# Patient Record
Sex: Female | Born: 1940
Health system: Southern US, Community
[De-identification: ages and names within clinical notes are randomized; demographics above are authoritative.]

## PROBLEM LIST (undated history)

## (undated) DIAGNOSIS — I10 Essential (primary) hypertension: Secondary | ICD-10-CM

## (undated) DIAGNOSIS — Z923 Personal history of irradiation: Secondary | ICD-10-CM

## (undated) HISTORY — PX: BREAST BIOPSY: SHX20

---

## 2002-03-07 ENCOUNTER — Encounter: Admission: RE | Admit: 2002-03-07 | Discharge: 2002-03-07 | Payer: Self-pay | Admitting: Family Medicine

## 2002-03-07 ENCOUNTER — Encounter: Payer: Self-pay | Admitting: Family Medicine

## 2002-12-13 ENCOUNTER — Ambulatory Visit (HOSPITAL_COMMUNITY): Admission: RE | Admit: 2002-12-13 | Discharge: 2002-12-13 | Payer: Self-pay

## 2003-09-25 ENCOUNTER — Encounter: Admission: RE | Admit: 2003-09-25 | Discharge: 2003-09-25 | Payer: Self-pay | Admitting: Family Medicine

## 2003-09-28 DIAGNOSIS — C50919 Malignant neoplasm of unspecified site of unspecified female breast: Secondary | ICD-10-CM

## 2003-09-28 HISTORY — PX: BREAST LUMPECTOMY: SHX2

## 2003-09-28 HISTORY — DX: Malignant neoplasm of unspecified site of unspecified female breast: C50.919

## 2003-10-21 ENCOUNTER — Encounter: Admission: RE | Admit: 2003-10-21 | Discharge: 2003-10-21 | Payer: Self-pay | Admitting: Family Medicine

## 2003-11-29 ENCOUNTER — Encounter (INDEPENDENT_AMBULATORY_CARE_PROVIDER_SITE_OTHER): Payer: Self-pay | Admitting: *Deleted

## 2003-11-29 ENCOUNTER — Encounter: Admission: RE | Admit: 2003-11-29 | Discharge: 2003-11-29 | Payer: Self-pay | Admitting: General Surgery

## 2003-12-03 HISTORY — PX: BREAST BIOPSY: SHX20

## 2003-12-09 ENCOUNTER — Encounter (HOSPITAL_COMMUNITY): Admission: RE | Admit: 2003-12-09 | Discharge: 2004-03-08 | Payer: Self-pay | Admitting: General Surgery

## 2003-12-11 ENCOUNTER — Ambulatory Visit (HOSPITAL_BASED_OUTPATIENT_CLINIC_OR_DEPARTMENT_OTHER): Admission: RE | Admit: 2003-12-11 | Discharge: 2003-12-11 | Payer: Self-pay | Admitting: General Surgery

## 2003-12-11 ENCOUNTER — Encounter (INDEPENDENT_AMBULATORY_CARE_PROVIDER_SITE_OTHER): Payer: Self-pay | Admitting: Specialist

## 2003-12-11 ENCOUNTER — Ambulatory Visit (HOSPITAL_COMMUNITY): Admission: RE | Admit: 2003-12-11 | Discharge: 2003-12-11 | Payer: Self-pay | Admitting: General Surgery

## 2003-12-16 ENCOUNTER — Ambulatory Visit: Admission: RE | Admit: 2003-12-16 | Discharge: 2004-02-28 | Payer: Self-pay | Admitting: Radiation Oncology

## 2004-02-25 ENCOUNTER — Ambulatory Visit (HOSPITAL_COMMUNITY): Admission: RE | Admit: 2004-02-25 | Discharge: 2004-02-25 | Payer: Self-pay | Admitting: Oncology

## 2004-02-28 ENCOUNTER — Other Ambulatory Visit: Admission: RE | Admit: 2004-02-28 | Discharge: 2004-02-28 | Payer: Self-pay | Admitting: Obstetrics and Gynecology

## 2004-03-16 ENCOUNTER — Encounter: Admission: RE | Admit: 2004-03-16 | Discharge: 2004-03-16 | Payer: Self-pay | Admitting: Oncology

## 2004-04-02 ENCOUNTER — Ambulatory Visit: Admission: RE | Admit: 2004-04-02 | Discharge: 2004-04-02 | Payer: Self-pay | Admitting: Radiation Oncology

## 2004-06-09 ENCOUNTER — Encounter: Admission: RE | Admit: 2004-06-09 | Discharge: 2004-06-09 | Payer: Self-pay | Admitting: General Surgery

## 2004-09-01 ENCOUNTER — Ambulatory Visit: Payer: Self-pay | Admitting: Oncology

## 2004-11-23 ENCOUNTER — Encounter: Admission: RE | Admit: 2004-11-23 | Discharge: 2004-11-23 | Payer: Self-pay | Admitting: General Surgery

## 2005-03-01 ENCOUNTER — Ambulatory Visit: Payer: Self-pay | Admitting: Oncology

## 2005-03-11 ENCOUNTER — Other Ambulatory Visit: Admission: RE | Admit: 2005-03-11 | Discharge: 2005-03-11 | Payer: Self-pay | Admitting: Obstetrics and Gynecology

## 2005-08-30 ENCOUNTER — Ambulatory Visit: Payer: Self-pay | Admitting: Oncology

## 2005-11-24 ENCOUNTER — Encounter: Admission: RE | Admit: 2005-11-24 | Discharge: 2005-11-24 | Payer: Self-pay | Admitting: General Surgery

## 2006-03-03 ENCOUNTER — Ambulatory Visit: Payer: Self-pay | Admitting: Oncology

## 2006-03-07 LAB — COMPREHENSIVE METABOLIC PANEL
ALT: 10 U/L (ref 0–40)
AST: 13 U/L (ref 0–37)
Albumin: 4.3 g/dL (ref 3.5–5.2)
Alkaline Phosphatase: 83 U/L (ref 39–117)
Glucose, Bld: 99 mg/dL (ref 70–99)
Potassium: 4 mEq/L (ref 3.5–5.3)
Sodium: 143 mEq/L (ref 135–145)
Total Protein: 6.8 g/dL (ref 6.0–8.3)

## 2006-03-07 LAB — CBC WITH DIFFERENTIAL/PLATELET
Eosinophils Absolute: 0.2 10*3/uL (ref 0.0–0.5)
MCV: 93.6 fL (ref 81.0–101.0)
MONO%: 10.5 % (ref 0.0–13.0)
NEUT#: 2.8 10*3/uL (ref 1.5–6.5)
RBC: 4.47 10*6/uL (ref 3.70–5.32)
RDW: 12.9 % (ref 11.3–14.5)
WBC: 5.7 10*3/uL (ref 3.9–10.0)

## 2006-03-18 ENCOUNTER — Encounter: Admission: RE | Admit: 2006-03-18 | Discharge: 2006-03-18 | Payer: Self-pay | Admitting: Oncology

## 2006-03-25 ENCOUNTER — Other Ambulatory Visit: Admission: RE | Admit: 2006-03-25 | Discharge: 2006-03-25 | Payer: Self-pay | Admitting: Obstetrics and Gynecology

## 2006-11-22 ENCOUNTER — Ambulatory Visit: Payer: Self-pay | Admitting: Oncology

## 2006-11-25 ENCOUNTER — Encounter: Admission: RE | Admit: 2006-11-25 | Discharge: 2006-11-25 | Payer: Self-pay | Admitting: Oncology

## 2007-03-02 ENCOUNTER — Ambulatory Visit: Payer: Self-pay | Admitting: Oncology

## 2007-03-06 LAB — COMPREHENSIVE METABOLIC PANEL
Albumin: 4.5 g/dL (ref 3.5–5.2)
BUN: 15 mg/dL (ref 6–23)
CO2: 23 mEq/L (ref 19–32)
Glucose, Bld: 86 mg/dL (ref 70–99)
Potassium: 4.5 mEq/L (ref 3.5–5.3)
Sodium: 143 mEq/L (ref 135–145)
Total Bilirubin: 0.4 mg/dL (ref 0.3–1.2)
Total Protein: 7 g/dL (ref 6.0–8.3)

## 2007-03-06 LAB — CBC WITH DIFFERENTIAL/PLATELET
Basophils Absolute: 0 10*3/uL (ref 0.0–0.1)
Eosinophils Absolute: 0.2 10*3/uL (ref 0.0–0.5)
HGB: 14.1 g/dL (ref 11.6–15.9)
LYMPH%: 32.7 % (ref 14.0–48.0)
MCV: 91.5 fL (ref 81.0–101.0)
MONO#: 0.7 10*3/uL (ref 0.1–0.9)
MONO%: 11.1 % (ref 0.0–13.0)
NEUT#: 3.5 10*3/uL (ref 1.5–6.5)
Platelets: 148 10*3/uL (ref 145–400)
RBC: 4.36 10*6/uL (ref 3.70–5.32)
WBC: 6.5 10*3/uL (ref 3.9–10.0)

## 2007-03-06 LAB — CANCER ANTIGEN 27.29: CA 27.29: 21 U/mL (ref 0–39)

## 2007-03-27 ENCOUNTER — Other Ambulatory Visit: Admission: RE | Admit: 2007-03-27 | Discharge: 2007-03-27 | Payer: Self-pay | Admitting: Obstetrics and Gynecology

## 2007-05-16 ENCOUNTER — Ambulatory Visit (HOSPITAL_COMMUNITY): Admission: RE | Admit: 2007-05-16 | Discharge: 2007-05-16 | Payer: Self-pay | Admitting: Ophthalmology

## 2007-12-01 ENCOUNTER — Encounter: Admission: RE | Admit: 2007-12-01 | Discharge: 2007-12-01 | Payer: Self-pay | Admitting: Oncology

## 2008-02-29 ENCOUNTER — Ambulatory Visit: Payer: Self-pay | Admitting: Oncology

## 2008-03-04 LAB — CBC WITH DIFFERENTIAL/PLATELET
Basophils Absolute: 0 10*3/uL (ref 0.0–0.1)
Eosinophils Absolute: 0.2 10*3/uL (ref 0.0–0.5)
HGB: 15.8 g/dL (ref 11.6–15.9)
MCV: 93.7 fL (ref 81.0–101.0)
MONO%: 10.1 % (ref 0.0–13.0)
NEUT#: 3.2 10*3/uL (ref 1.5–6.5)
Platelets: 156 10*3/uL (ref 145–400)
RDW: 12.7 % (ref 11.3–14.5)

## 2008-03-05 LAB — COMPREHENSIVE METABOLIC PANEL
Albumin: 4.5 g/dL (ref 3.5–5.2)
Alkaline Phosphatase: 101 U/L (ref 39–117)
BUN: 14 mg/dL (ref 6–23)
CO2: 25 mEq/L (ref 19–32)
Calcium: 9.8 mg/dL (ref 8.4–10.5)
Glucose, Bld: 110 mg/dL — ABNORMAL HIGH (ref 70–99)
Potassium: 4.9 mEq/L (ref 3.5–5.3)

## 2008-03-19 ENCOUNTER — Encounter: Admission: RE | Admit: 2008-03-19 | Discharge: 2008-03-19 | Payer: Self-pay | Admitting: Oncology

## 2008-03-25 LAB — COMPREHENSIVE METABOLIC PANEL
AST: 33 U/L (ref 0–37)
Albumin: 3.3 g/dL — ABNORMAL LOW (ref 3.5–5.2)
BUN: 12 mg/dL (ref 6–23)
Calcium: 9.4 mg/dL (ref 8.4–10.5)
Chloride: 100 mEq/L (ref 96–112)
Glucose, Bld: 138 mg/dL — ABNORMAL HIGH (ref 70–99)
Potassium: 4.4 mEq/L (ref 3.5–5.3)

## 2008-03-28 ENCOUNTER — Other Ambulatory Visit: Admission: RE | Admit: 2008-03-28 | Discharge: 2008-03-28 | Payer: Self-pay | Admitting: Obstetrics and Gynecology

## 2008-11-19 ENCOUNTER — Encounter: Admission: RE | Admit: 2008-11-19 | Discharge: 2008-11-19 | Payer: Self-pay | Admitting: Family Medicine

## 2008-12-02 ENCOUNTER — Encounter: Admission: RE | Admit: 2008-12-02 | Discharge: 2008-12-02 | Payer: Self-pay | Admitting: Oncology

## 2009-03-14 ENCOUNTER — Ambulatory Visit: Payer: Self-pay | Admitting: Oncology

## 2009-03-19 LAB — CBC WITH DIFFERENTIAL/PLATELET
Basophils Absolute: 0 10*3/uL (ref 0.0–0.1)
EOS%: 3.3 % (ref 0.0–7.0)
HCT: 42.1 % (ref 34.8–46.6)
HGB: 15.2 g/dL (ref 11.6–15.9)
MCH: 34.7 pg — ABNORMAL HIGH (ref 25.1–34.0)
MCV: 95.9 fL (ref 79.5–101.0)
MONO%: 8.3 % (ref 0.0–14.0)
NEUT%: 60.1 % (ref 38.4–76.8)

## 2009-03-19 LAB — COMPREHENSIVE METABOLIC PANEL
AST: 19 U/L (ref 0–37)
Alkaline Phosphatase: 93 U/L (ref 39–117)
BUN: 11 mg/dL (ref 6–23)
Calcium: 9.2 mg/dL (ref 8.4–10.5)
Creatinine, Ser: 0.72 mg/dL (ref 0.40–1.20)

## 2009-04-01 ENCOUNTER — Other Ambulatory Visit: Admission: RE | Admit: 2009-04-01 | Discharge: 2009-04-01 | Payer: Self-pay | Admitting: Obstetrics and Gynecology

## 2009-12-08 ENCOUNTER — Encounter: Admission: RE | Admit: 2009-12-08 | Discharge: 2009-12-08 | Payer: Self-pay | Admitting: Family Medicine

## 2010-03-10 ENCOUNTER — Ambulatory Visit: Payer: Self-pay | Admitting: Oncology

## 2010-03-12 LAB — CBC WITH DIFFERENTIAL/PLATELET
BASO%: 0.4 % (ref 0.0–2.0)
Basophils Absolute: 0 10*3/uL (ref 0.0–0.1)
EOS%: 2.7 % (ref 0.0–7.0)
Eosinophils Absolute: 0.2 10*3/uL (ref 0.0–0.5)
HCT: 42.4 % (ref 34.8–46.6)
HGB: 14.4 g/dL (ref 11.6–15.9)
LYMPH%: 29.9 % (ref 14.0–49.7)
MCH: 33.2 pg (ref 25.1–34.0)
MCHC: 34.1 g/dL (ref 31.5–36.0)
MCV: 97.6 fL (ref 79.5–101.0)
MONO#: 0.8 10*3/uL (ref 0.1–0.9)
MONO%: 10.1 % (ref 0.0–14.0)
NEUT#: 4.3 10*3/uL (ref 1.5–6.5)
NEUT%: 56.9 % (ref 38.4–76.8)
Platelets: 144 10*3/uL — ABNORMAL LOW (ref 145–400)
RBC: 4.35 10*6/uL (ref 3.70–5.45)
RDW: 12.7 % (ref 11.2–14.5)
WBC: 7.5 10*3/uL (ref 3.9–10.3)
lymph#: 2.3 10*3/uL (ref 0.9–3.3)

## 2010-03-12 LAB — COMPREHENSIVE METABOLIC PANEL
ALT: 20 U/L (ref 0–35)
AST: 21 U/L (ref 0–37)
Albumin: 4.4 g/dL (ref 3.5–5.2)
Alkaline Phosphatase: 88 U/L (ref 39–117)
BUN: 15 mg/dL (ref 6–23)
CO2: 24 mEq/L (ref 19–32)
Calcium: 9 mg/dL (ref 8.4–10.5)
Chloride: 103 mEq/L (ref 96–112)
Creatinine, Ser: 0.79 mg/dL (ref 0.40–1.20)
Glucose, Bld: 79 mg/dL (ref 70–99)
Potassium: 4.3 mEq/L (ref 3.5–5.3)
Sodium: 138 mEq/L (ref 135–145)
Total Bilirubin: 0.3 mg/dL (ref 0.3–1.2)
Total Protein: 7.1 g/dL (ref 6.0–8.3)

## 2010-03-12 LAB — CANCER ANTIGEN 27.29: CA 27.29: 31 U/mL (ref 0–39)

## 2010-04-02 ENCOUNTER — Other Ambulatory Visit: Admission: RE | Admit: 2010-04-02 | Discharge: 2010-04-02 | Payer: Self-pay | Admitting: Obstetrics and Gynecology

## 2010-11-04 ENCOUNTER — Other Ambulatory Visit: Payer: Self-pay | Admitting: Family Medicine

## 2010-11-04 DIAGNOSIS — Z9889 Other specified postprocedural states: Secondary | ICD-10-CM

## 2010-11-04 DIAGNOSIS — Z853 Personal history of malignant neoplasm of breast: Secondary | ICD-10-CM

## 2010-12-10 ENCOUNTER — Ambulatory Visit
Admission: RE | Admit: 2010-12-10 | Discharge: 2010-12-10 | Disposition: A | Discharge status: Home / Self Care | Payer: Medicare Other | Source: Ambulatory Visit | Attending: Family Medicine | Admitting: Family Medicine

## 2010-12-10 DIAGNOSIS — Z853 Personal history of malignant neoplasm of breast: Secondary | ICD-10-CM

## 2010-12-10 DIAGNOSIS — Z9889 Other specified postprocedural states: Secondary | ICD-10-CM

## 2011-02-09 NOTE — Op Note (Signed)
NAMEKIOWA, PEIFER                ACCOUNT NO.:  [id number]   MEDICAL RECORD NO.:  [id number]          PATIENT TYPE:  AMB   LOCATION:  SDS                          FACILITY:  [facility]   PHYSICIAN:  Robert L. Dione Booze, M.D.  DATE OF BIRTH:  07-Mar-1941   DATE OF PROCEDURE:  05/16/2007  DATE OF DISCHARGE:                               OPERATIVE REPORT   Tiny Chaudhary was seen most recently in my office on April 19, 2007 to  consider upper eyelid blepharoplasties.  She was first seen October 21, 2006 and referred to Dr. 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  She reports significant difficulty with the skin of her upper  eyelids causing irritation and some reduction in the visual field and  some fatigue.  A letter was written to her insurance company along with  photographs and visual field testing to show the medical need for this.  She does have significant symptoms.  Otherwise her eye examination shows  the vision is correctable to 20/25 in the right and to around 20/100 in  the left.  Pressures are 16 in each eye.  Visual field testing does show  superior loss, particularly when the skin is taped compared to when it  is not taped.  There is significant loss of the superior field.  The  pupils motility, conjunctiva cornea, anterior chamber upon this exam  shows abnormality on the left compatible with the old vessel occlusion.  At the slit lamp, she has early cataracts.  Externally, she does have  significant dermatochalasis and the skin of each upper eyelid does cover  her eyelashes and block the peripheral field of vision.  After this was  discussed, she felt she did want to have upper eyelid blepharoplasties  for visual reasons.  Medically she should be stable for this.   JUSTIFICATION FOR PERFORMING THE PROCEDURE IN AN OUTPATIENT SETTING:  Routine.   JUSTIFICATION FOR OVERNIGHT STAY:  None.   PREOPERATIVE DIAGNOSIS:  Severe dermatochalasis with visual impairment.   POSTOPERATIVE DIAGNOSIS:  Severe dermatochalasis with visual impairment.   OPERATION PERFORMED:  Upper eyelid blepharoplasties.   SURGEON:  Robert L. Dione Booze, M.D.   ANESTHESIA:  Xylocaine 1% with epinephrine.   PROCEDURE:  The patient arrived in the operating room and was prepped  and draped in the routine fashion.  Xylocaine 1% with epinephrine was  given to the skin of each upper eyelid and the skin to be removed was  carefully demarcated and then excised.  Underlying subcutaneous tissue  and some fatty tissue was also removed and pressure was controlled with  bleeding and cautery.  Each wound was then closed with a running 6-0  nylon suture and cold compresses were applied.  The patient left the  minor room having done nicely.   FOLLOWUP CARE:  The patient is to be seen in my office early the next  week to have the sutures removed.  She is to used cold compresses today  and warm compresses starting tomorrow.  ______________________________  Doris Cheadle Dione Booze, M.D.     RLG/MEDQ  D:  05/16/2007  T:  05/17/2007  Job:  [id number]

## 2011-02-12 NOTE — Op Note (Signed)
Sonya Cantu, Sonya Cantu                          ACCOUNT NO.:  [id number]   MEDICAL RECORD NO.:  [id number]                   PATIENT TYPE:  AMB   LOCATION:  DSC                                  FACILITY:  [facility]   PHYSICIAN:  Rose Phi. Maple Hudson, M.D.                DATE OF BIRTH:  22-Mar-1941   DATE OF PROCEDURE:  12/11/2003  DATE OF DISCHARGE:                                 OPERATIVE REPORT   PREOPERATIVE DIAGNOSIS:  Probable stage I carcinoma of the right breast.   POSTOPERATIVE DIAGNOSIS:  Probable stage I carcinoma of the right breast.   OPERATION:  1. Blue dye injection.  2. Right sentinel lymph node biopsy.  3. Right partial mastectomy.   OPERATIVE PROCEDURE:  Prior to coming to the operating room, 1 millicurie of  technetium sulfur colloid was injected intradermally.  After suitable  general anesthesia was induced, 5 mL of a mixture of 2 mL of methylene blue  and 3 mL of injectable saline was injected in the subareolar tissue and the  breast gently massaged for three minutes.  After prepping and draping, I  made a short transverse axillary incision with dissection down through the  subcutaneous tissue to the clavipectoral fascia.  There was a very prominent  blue lymphatic going there and I carefully dissected along it to a small  cluster of blue and hot lymph nodes which we excised.  There were no other  palpable nodes.  There were no other blue or hot nodes.  These were  submitted as a sentinel node.   While that was being done, a curved incision overlying the palpable nodule  at about the 12 o'clock position was made including an ellipse of skin.  The  incision was made and a wide excision of this palpable area was carried out.  The specimen was oriented for the pathologist and submitted for touch prep  for margins.   With good hemostasis of both incisions, I injected both with 0.25% Marcaine.  We then closed in layers with 3-0 Vicryl and subcuticular Monocryl and Steri-  Strips.  The touch prep on the nodes was reported, on a preliminary report,  as negative and the margins were clean.  Dressings were then applied and the  patient transferred to the recovery room in satisfactory condition, having  tolerated the procedure well.                                               Rose Phi. Maple Hudson, M.D.    PRY/MEDQ  D:  12/11/2003  T:  12/12/2003  Job:  [id number]

## 2011-04-06 ENCOUNTER — Other Ambulatory Visit: Payer: Self-pay | Admitting: Obstetrics and Gynecology

## 2011-04-06 ENCOUNTER — Other Ambulatory Visit (HOSPITAL_COMMUNITY)
Admission: RE | Admit: 2011-04-06 | Discharge: 2011-04-06 | Disposition: A | Discharge status: Home / Self Care | Payer: Medicare Other | Source: Ambulatory Visit | Attending: Obstetrics and Gynecology | Admitting: Obstetrics and Gynecology

## 2011-04-06 DIAGNOSIS — Z124 Encounter for screening for malignant neoplasm of cervix: Secondary | ICD-10-CM | POA: Insufficient documentation

## 2011-05-17 ENCOUNTER — Other Ambulatory Visit: Payer: Self-pay | Admitting: Gastroenterology

## 2011-07-22 ENCOUNTER — Other Ambulatory Visit: Payer: Self-pay | Admitting: Dermatology

## 2011-11-03 ENCOUNTER — Other Ambulatory Visit: Payer: Self-pay | Admitting: Family Medicine

## 2011-11-03 DIAGNOSIS — Z853 Personal history of malignant neoplasm of breast: Secondary | ICD-10-CM

## 2011-11-03 DIAGNOSIS — Z1231 Encounter for screening mammogram for malignant neoplasm of breast: Secondary | ICD-10-CM

## 2011-12-13 ENCOUNTER — Ambulatory Visit
Admission: RE | Admit: 2011-12-13 | Discharge: 2011-12-13 | Disposition: A | Discharge status: Home / Self Care | Payer: Medicare Other | Source: Ambulatory Visit | Attending: Family Medicine | Admitting: Family Medicine

## 2011-12-13 DIAGNOSIS — Z1231 Encounter for screening mammogram for malignant neoplasm of breast: Secondary | ICD-10-CM

## 2012-11-08 ENCOUNTER — Other Ambulatory Visit: Payer: Self-pay | Admitting: Family Medicine

## 2012-11-08 DIAGNOSIS — Z1231 Encounter for screening mammogram for malignant neoplasm of breast: Secondary | ICD-10-CM

## 2012-12-14 ENCOUNTER — Ambulatory Visit: Payer: Medicare Other

## 2013-01-05 ENCOUNTER — Ambulatory Visit
Admission: RE | Admit: 2013-01-05 | Discharge: 2013-01-05 | Disposition: A | Discharge status: Home / Self Care | Payer: Medicare Other | Source: Ambulatory Visit | Attending: Family Medicine | Admitting: Family Medicine

## 2013-01-05 DIAGNOSIS — Z1231 Encounter for screening mammogram for malignant neoplasm of breast: Secondary | ICD-10-CM

## 2013-04-12 ENCOUNTER — Other Ambulatory Visit (HOSPITAL_COMMUNITY)
Admission: RE | Admit: 2013-04-12 | Discharge: 2013-04-12 | Disposition: A | Discharge status: Home / Self Care | Payer: Medicare Other | Source: Ambulatory Visit | Attending: Obstetrics and Gynecology | Admitting: Obstetrics and Gynecology

## 2013-04-12 ENCOUNTER — Other Ambulatory Visit: Payer: Self-pay | Admitting: Obstetrics and Gynecology

## 2013-04-12 DIAGNOSIS — R87619 Unspecified abnormal cytological findings in specimens from cervix uteri: Secondary | ICD-10-CM | POA: Insufficient documentation

## 2013-04-12 DIAGNOSIS — R87810 Cervical high risk human papillomavirus (HPV) DNA test positive: Secondary | ICD-10-CM | POA: Insufficient documentation

## 2013-04-12 DIAGNOSIS — Z124 Encounter for screening for malignant neoplasm of cervix: Secondary | ICD-10-CM | POA: Insufficient documentation

## 2013-11-27 ENCOUNTER — Other Ambulatory Visit: Payer: Self-pay

## 2013-11-27 DIAGNOSIS — Z1231 Encounter for screening mammogram for malignant neoplasm of breast: Secondary | ICD-10-CM

## 2013-11-27 DIAGNOSIS — Z853 Personal history of malignant neoplasm of breast: Secondary | ICD-10-CM

## 2013-11-27 DIAGNOSIS — Z9889 Other specified postprocedural states: Secondary | ICD-10-CM

## 2014-01-07 ENCOUNTER — Ambulatory Visit
Admission: RE | Admit: 2014-01-07 | Discharge: 2014-01-07 | Disposition: A | Discharge status: Home / Self Care | Payer: Medicare Other | Source: Ambulatory Visit

## 2014-01-07 DIAGNOSIS — Z9889 Other specified postprocedural states: Secondary | ICD-10-CM

## 2014-01-07 DIAGNOSIS — Z1231 Encounter for screening mammogram for malignant neoplasm of breast: Secondary | ICD-10-CM

## 2014-01-07 DIAGNOSIS — Z853 Personal history of malignant neoplasm of breast: Secondary | ICD-10-CM

## 2014-04-17 ENCOUNTER — Other Ambulatory Visit: Payer: Self-pay | Admitting: Obstetrics and Gynecology

## 2014-04-17 ENCOUNTER — Other Ambulatory Visit (HOSPITAL_COMMUNITY)
Admission: RE | Admit: 2014-04-17 | Discharge: 2014-04-17 | Disposition: A | Discharge status: Home / Self Care | Payer: Medicare Other | Source: Ambulatory Visit | Attending: Obstetrics and Gynecology | Admitting: Obstetrics and Gynecology

## 2014-04-17 DIAGNOSIS — Z124 Encounter for screening for malignant neoplasm of cervix: Secondary | ICD-10-CM | POA: Diagnosis present

## 2014-04-19 LAB — CYTOLOGY - PAP

## 2014-12-02 ENCOUNTER — Other Ambulatory Visit: Payer: Self-pay

## 2014-12-02 DIAGNOSIS — Z1231 Encounter for screening mammogram for malignant neoplasm of breast: Secondary | ICD-10-CM

## 2015-01-10 ENCOUNTER — Ambulatory Visit
Admission: RE | Admit: 2015-01-10 | Discharge: 2015-01-10 | Disposition: A | Discharge status: Home / Self Care | Payer: Medicare Other | Source: Ambulatory Visit

## 2015-01-10 DIAGNOSIS — Z1231 Encounter for screening mammogram for malignant neoplasm of breast: Secondary | ICD-10-CM

## 2015-12-09 ENCOUNTER — Other Ambulatory Visit: Payer: Self-pay

## 2015-12-09 DIAGNOSIS — Z1231 Encounter for screening mammogram for malignant neoplasm of breast: Secondary | ICD-10-CM

## 2016-01-05 ENCOUNTER — Other Ambulatory Visit: Payer: Self-pay | Admitting: Orthopedic Surgery

## 2016-01-05 DIAGNOSIS — M25511 Pain in right shoulder: Secondary | ICD-10-CM

## 2016-01-06 ENCOUNTER — Ambulatory Visit
Admission: RE | Admit: 2016-01-06 | Discharge: 2016-01-06 | Disposition: A | Discharge status: Home / Self Care | Payer: Medicare Other | Source: Ambulatory Visit | Attending: Orthopedic Surgery | Admitting: Orthopedic Surgery

## 2016-01-06 DIAGNOSIS — M25511 Pain in right shoulder: Secondary | ICD-10-CM

## 2016-01-12 ENCOUNTER — Ambulatory Visit
Admission: RE | Admit: 2016-01-12 | Discharge: 2016-01-12 | Disposition: A | Discharge status: Home / Self Care | Payer: Medicare Other | Source: Ambulatory Visit

## 2016-01-12 DIAGNOSIS — Z1231 Encounter for screening mammogram for malignant neoplasm of breast: Secondary | ICD-10-CM

## 2016-04-28 ENCOUNTER — Other Ambulatory Visit: Payer: Self-pay | Admitting: Obstetrics and Gynecology

## 2016-04-28 ENCOUNTER — Other Ambulatory Visit (HOSPITAL_COMMUNITY)
Admission: RE | Admit: 2016-04-28 | Discharge: 2016-04-28 | Disposition: A | Discharge status: Home / Self Care | Payer: Medicare Other | Source: Ambulatory Visit | Attending: Obstetrics and Gynecology | Admitting: Obstetrics and Gynecology

## 2016-04-28 DIAGNOSIS — Z01419 Encounter for gynecological examination (general) (routine) without abnormal findings: Secondary | ICD-10-CM | POA: Diagnosis present

## 2016-04-28 DIAGNOSIS — Z1151 Encounter for screening for human papillomavirus (HPV): Secondary | ICD-10-CM | POA: Insufficient documentation

## 2016-04-29 LAB — CYTOLOGY - PAP

## 2016-07-19 ENCOUNTER — Other Ambulatory Visit (HOSPITAL_COMMUNITY): Payer: Self-pay | Admitting: Respiratory Therapy

## 2016-07-19 ENCOUNTER — Other Ambulatory Visit: Payer: Self-pay | Admitting: Family Medicine

## 2016-07-19 ENCOUNTER — Ambulatory Visit
Admission: RE | Admit: 2016-07-19 | Discharge: 2016-07-19 | Disposition: A | Discharge status: Home / Self Care | Payer: Medicare Other | Source: Ambulatory Visit | Attending: Family Medicine | Admitting: Family Medicine

## 2016-07-19 DIAGNOSIS — J45909 Unspecified asthma, uncomplicated: Secondary | ICD-10-CM

## 2016-07-26 ENCOUNTER — Ambulatory Visit (HOSPITAL_COMMUNITY)
Admission: RE | Admit: 2016-07-26 | Discharge: 2016-07-26 | Disposition: A | Discharge status: Home / Self Care | Payer: Medicare Other | Source: Ambulatory Visit | Attending: Family Medicine | Admitting: Family Medicine

## 2016-07-26 DIAGNOSIS — J45909 Unspecified asthma, uncomplicated: Secondary | ICD-10-CM | POA: Diagnosis present

## 2016-07-26 LAB — PULMONARY FUNCTION TEST
DL/VA % PRED: 73 %
DL/VA: 3.52 ml/min/mmHg/L
DLCO UNC: 11.85 ml/min/mmHg
DLCO unc % pred: 48 %
FEF 25-75 POST: 0.73 L/s
FEF 25-75 Pre: 0.66 L/sec
FEF2575-%Change-Post: 11 %
FEF2575-%Pred-Post: 45 %
FEF2575-%Pred-Pre: 40 %
FEV1-%CHANGE-POST: -8 %
FEV1-%PRED-POST: 51 %
FEV1-%PRED-PRE: 55 %
FEV1-POST: 1.06 L
FEV1-Pre: 1.16 L
FEV1FVC-%Change-Post: -21 %
FEV1FVC-%PRED-PRE: 89 %
FEV6-%Change-Post: 3 %
FEV6-%PRED-POST: 67 %
FEV6-%Pred-Pre: 64 %
FEV6-POST: 1.78 L
FEV6-Pre: 1.72 L
FEV6FVC-%CHANGE-POST: 0 %
FEV6FVC-%PRED-POST: 104 %
FEV6FVC-%Pred-Pre: 105 %
FVC-%Change-Post: 16 %
FVC-%Pred-Post: 71 %
FVC-%Pred-Pre: 61 %
FVC-Post: 2 L
FVC-Pre: 1.72 L
POST FEV6/FVC RATIO: 99 %
PRE FEV1/FVC RATIO: 67 %
PRE FEV6/FVC RATIO: 100 %
Post FEV1/FVC ratio: 53 %
RV % pred: 101 %
RV: 2.35 L
TLC % PRED: 86 %
TLC: 4.37 L

## 2016-07-26 MED ORDER — ALBUTEROL SULFATE (2.5 MG/3ML) 0.083% IN NEBU
2.5000 mg | INHALATION_SOLUTION | Freq: Once | RESPIRATORY_TRACT | Status: AC
  Administered 2016-07-26: 2.5 mg via RESPIRATORY_TRACT

## 2016-12-07 ENCOUNTER — Other Ambulatory Visit: Payer: Self-pay | Admitting: Family Medicine

## 2016-12-07 DIAGNOSIS — Z1231 Encounter for screening mammogram for malignant neoplasm of breast: Secondary | ICD-10-CM

## 2017-01-13 ENCOUNTER — Ambulatory Visit
Admission: RE | Admit: 2017-01-13 | Discharge: 2017-01-13 | Disposition: A | Discharge status: Home / Self Care | Payer: Medicare Other | Source: Ambulatory Visit | Attending: Family Medicine | Admitting: Family Medicine

## 2017-01-13 DIAGNOSIS — Z1231 Encounter for screening mammogram for malignant neoplasm of breast: Secondary | ICD-10-CM

## 2017-02-03 ENCOUNTER — Other Ambulatory Visit (HOSPITAL_COMMUNITY): Payer: Self-pay | Admitting: Family Medicine

## 2017-02-03 DIAGNOSIS — R1013 Epigastric pain: Secondary | ICD-10-CM

## 2017-02-04 ENCOUNTER — Ambulatory Visit (HOSPITAL_COMMUNITY)
Admission: RE | Admit: 2017-02-04 | Discharge: 2017-02-04 | Disposition: A | Discharge status: Home / Self Care | Payer: Medicare Other | Source: Ambulatory Visit | Attending: Family Medicine | Admitting: Family Medicine

## 2017-02-04 DIAGNOSIS — R1013 Epigastric pain: Secondary | ICD-10-CM

## 2017-02-04 DIAGNOSIS — K760 Fatty (change of) liver, not elsewhere classified: Secondary | ICD-10-CM | POA: Insufficient documentation

## 2017-02-04 DIAGNOSIS — R1011 Right upper quadrant pain: Secondary | ICD-10-CM | POA: Diagnosis not present

## 2017-04-05 ENCOUNTER — Ambulatory Visit
Admission: RE | Admit: 2017-04-05 | Discharge: 2017-04-05 | Disposition: A | Discharge status: Home / Self Care | Payer: Medicare Other | Source: Ambulatory Visit | Attending: Family Medicine | Admitting: Family Medicine

## 2017-04-05 ENCOUNTER — Other Ambulatory Visit: Payer: Self-pay | Admitting: Family Medicine

## 2017-04-05 DIAGNOSIS — J45909 Unspecified asthma, uncomplicated: Secondary | ICD-10-CM

## 2017-05-03 ENCOUNTER — Other Ambulatory Visit: Payer: Self-pay | Admitting: Obstetrics and Gynecology

## 2017-05-03 ENCOUNTER — Other Ambulatory Visit (HOSPITAL_COMMUNITY)
Admission: RE | Admit: 2017-05-03 | Discharge: 2017-05-03 | Disposition: A | Discharge status: Home / Self Care | Payer: Medicare Other | Source: Ambulatory Visit | Attending: Obstetrics and Gynecology | Admitting: Obstetrics and Gynecology

## 2017-05-03 DIAGNOSIS — Z01419 Encounter for gynecological examination (general) (routine) without abnormal findings: Secondary | ICD-10-CM | POA: Insufficient documentation

## 2017-05-05 LAB — CYTOLOGY - PAP
Diagnosis: NEGATIVE
HPV: NOT DETECTED

## 2017-12-15 ENCOUNTER — Other Ambulatory Visit: Payer: Self-pay | Admitting: Family Medicine

## 2017-12-15 DIAGNOSIS — Z1231 Encounter for screening mammogram for malignant neoplasm of breast: Secondary | ICD-10-CM

## 2018-01-20 ENCOUNTER — Ambulatory Visit
Admission: RE | Admit: 2018-01-20 | Discharge: 2018-01-20 | Disposition: A | Discharge status: Home / Self Care | Payer: Medicare Other | Source: Ambulatory Visit | Attending: Family Medicine | Admitting: Family Medicine

## 2018-01-20 DIAGNOSIS — Z1231 Encounter for screening mammogram for malignant neoplasm of breast: Secondary | ICD-10-CM

## 2018-04-21 ENCOUNTER — Other Ambulatory Visit: Payer: Self-pay | Admitting: Physician Assistant

## 2018-04-21 ENCOUNTER — Ambulatory Visit
Admission: RE | Admit: 2018-04-21 | Discharge: 2018-04-21 | Disposition: A | Discharge status: Home / Self Care | Payer: Medicare Other | Source: Ambulatory Visit | Attending: Physician Assistant | Admitting: Physician Assistant

## 2018-04-21 DIAGNOSIS — R109 Unspecified abdominal pain: Secondary | ICD-10-CM

## 2018-12-12 ENCOUNTER — Other Ambulatory Visit: Payer: Self-pay | Admitting: Family Medicine

## 2018-12-12 DIAGNOSIS — Z1231 Encounter for screening mammogram for malignant neoplasm of breast: Secondary | ICD-10-CM

## 2019-01-25 ENCOUNTER — Ambulatory Visit: Payer: Medicare Other

## 2019-03-08 ENCOUNTER — Other Ambulatory Visit: Payer: Self-pay

## 2019-03-08 ENCOUNTER — Ambulatory Visit
Admission: RE | Admit: 2019-03-08 | Discharge: 2019-03-08 | Disposition: A | Discharge status: Home / Self Care | Payer: Medicare Other | Source: Ambulatory Visit | Attending: Family Medicine | Admitting: Family Medicine

## 2019-03-08 DIAGNOSIS — Z1231 Encounter for screening mammogram for malignant neoplasm of breast: Secondary | ICD-10-CM

## 2019-07-26 IMAGING — MG DIGITAL SCREENING BILATERAL MAMMOGRAM WITH TOMO AND CAD
6 of 10 series · 6 of 30 positions shown · non-contrast
Comparison: Previous exam(s).

CLINICAL DATA: Screening.

EXAM:
DIGITAL SCREENING BILATERAL MAMMOGRAM WITH TOMO AND CAD

[R CC synth-2D]
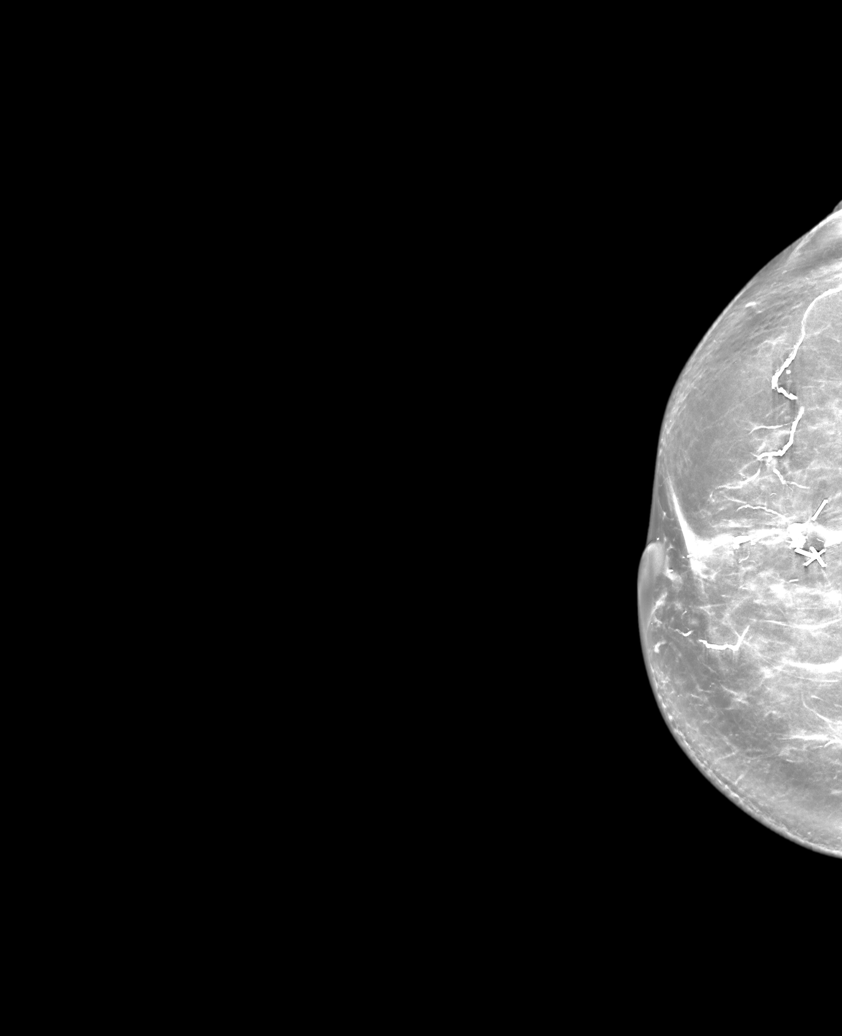

[L CC synth-2D]
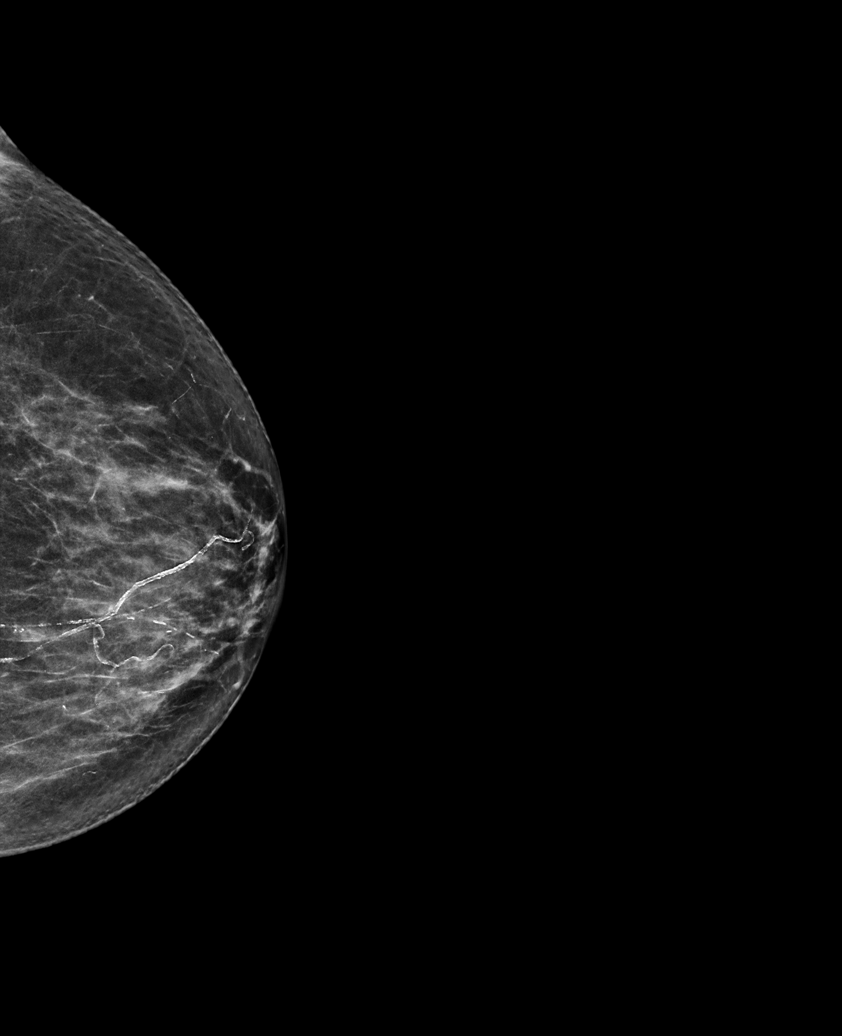

[R MLO synth-2D]
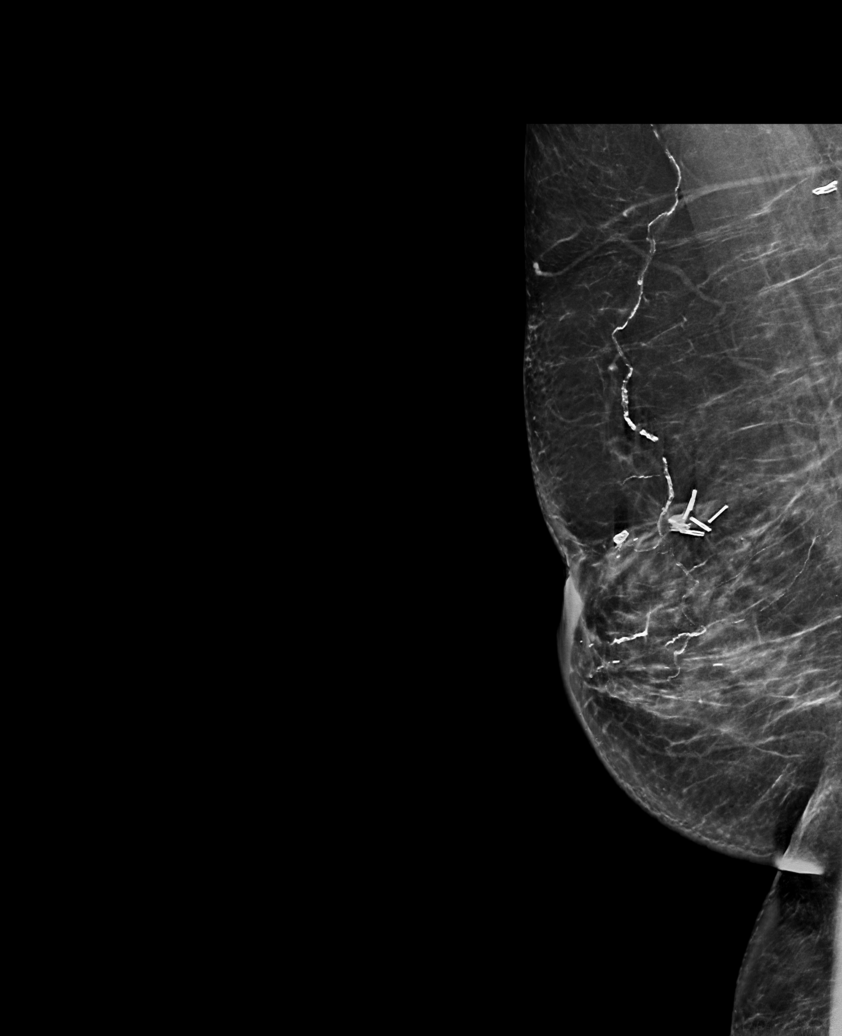

[L MLO synth-2D (1 of 2)]
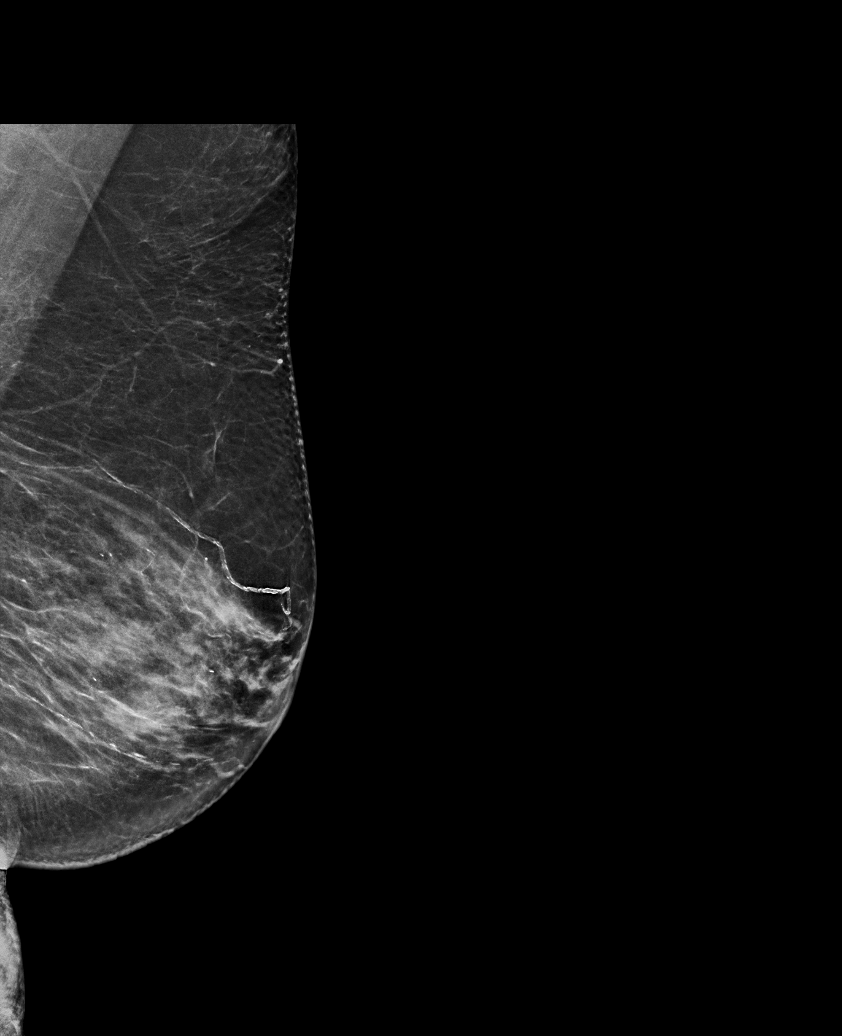

[L MLO synth-2D (2 of 2)]
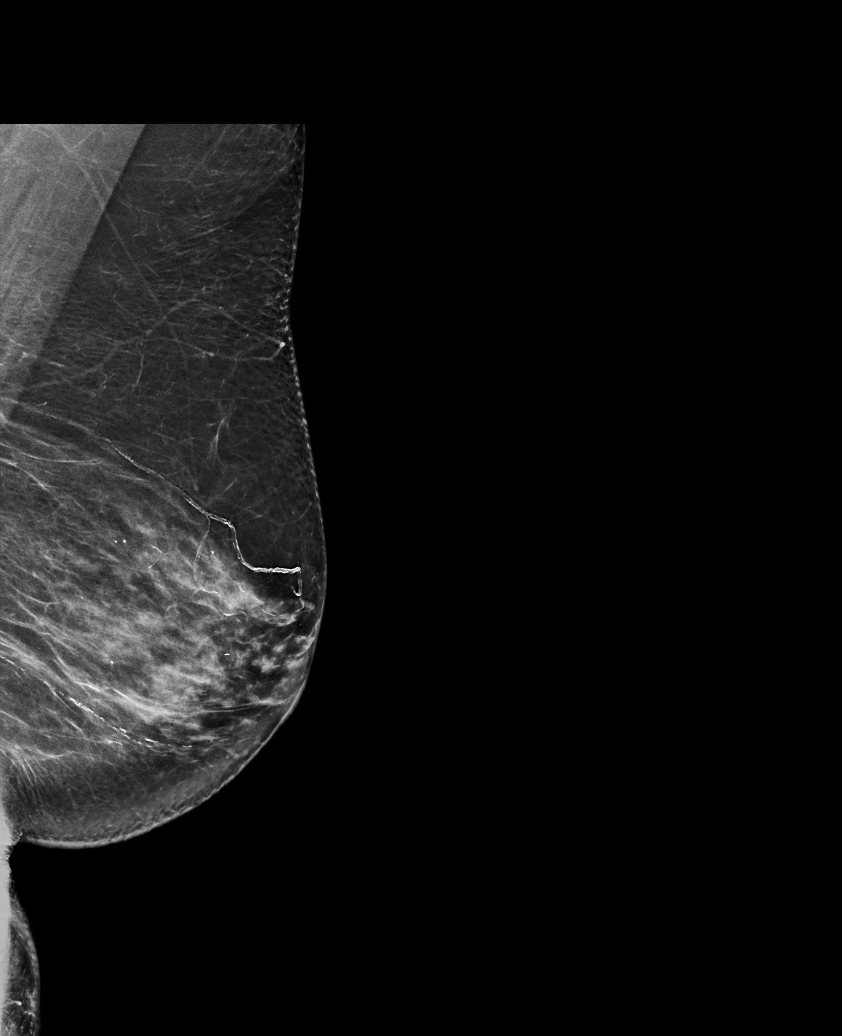

[R MLO tomo · tomo slice 39/78.0]
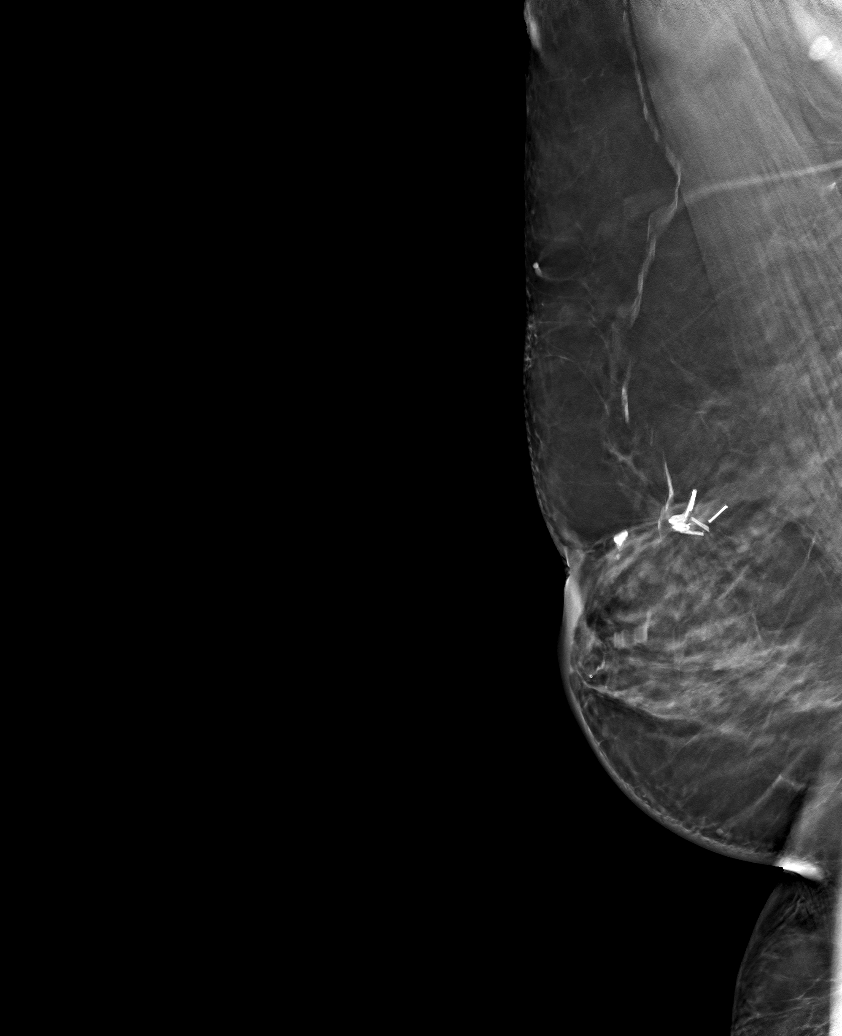

[6 of 30 positions shown; findings below may reference images not displayed]

ACR Breast Density Category b: There are scattered areas of
fibroglandular density.
FINDINGS: There are no findings suspicious for malignancy. Images were
processed with CAD.
IMPRESSION: No mammographic evidence of malignancy. A result letter of this
screening mammogram will be mailed directly to the patient.

RECOMMENDATION:
Screening mammogram in one year. (Code:CN-U-775)

BI-RADS CATEGORY  1: Negative.

## 2020-01-28 ENCOUNTER — Other Ambulatory Visit: Payer: Self-pay | Admitting: Family Medicine

## 2020-01-28 DIAGNOSIS — Z1231 Encounter for screening mammogram for malignant neoplasm of breast: Secondary | ICD-10-CM

## 2020-03-10 ENCOUNTER — Other Ambulatory Visit: Payer: Self-pay

## 2020-03-10 ENCOUNTER — Ambulatory Visit
Admission: RE | Admit: 2020-03-10 | Discharge: 2020-03-10 | Disposition: A | Discharge status: Home / Self Care | Payer: Medicare PPO | Source: Ambulatory Visit | Attending: Family Medicine | Admitting: Family Medicine

## 2020-03-10 DIAGNOSIS — Z1231 Encounter for screening mammogram for malignant neoplasm of breast: Secondary | ICD-10-CM

## 2020-04-14 ENCOUNTER — Ambulatory Visit: Payer: Medicare PPO | Admitting: Podiatry

## 2020-04-14 ENCOUNTER — Other Ambulatory Visit: Payer: Self-pay

## 2020-04-14 ENCOUNTER — Encounter: Payer: Self-pay | Admitting: Podiatry

## 2020-04-14 ENCOUNTER — Ambulatory Visit (INDEPENDENT_AMBULATORY_CARE_PROVIDER_SITE_OTHER): Payer: Medicare PPO

## 2020-04-14 DIAGNOSIS — Q828 Other specified congenital malformations of skin: Secondary | ICD-10-CM | POA: Diagnosis not present

## 2020-04-14 DIAGNOSIS — M2042 Other hammer toe(s) (acquired), left foot: Secondary | ICD-10-CM | POA: Diagnosis not present

## 2020-04-14 DIAGNOSIS — M2041 Other hammer toe(s) (acquired), right foot: Secondary | ICD-10-CM | POA: Diagnosis not present

## 2020-04-16 NOTE — Progress Notes (Signed)
Subjective:   Patient ID: Sonya Cantu, female   DOB: 79 y.o.   MRN: [id number]   HPI Patient presents stating I get a lot of pain in both my feet and I had orthotics which are 80 years old which gave me some benefit but they are starting to wear out.  The lesion on the left is become more bothersome and I tried to trim it myself but I cannot do it and it increasingly sore.  Patient does not smoke likes to be active   Review of Systems  All other systems reviewed and are negative.       Objective:  Physical Exam Vitals and nursing note reviewed.  Constitutional:      Appearance: She is well-developed.  Pulmonary:     Effort: Pulmonary effort is normal.  Musculoskeletal:        General: Normal range of motion.  Skin:    General: Skin is warm.  Neurological:     Mental Status: She is alert.     Neurovascular status was found to be intact muscle strength was found to be adequate range of motion was within normal limits.  Patient has digital deformities with elevated lesser digits bilateral with pressure against the metatarsals with exposed metatarsal heads on both feet that are painful when palpated and make walking difficult.  Patient has good digital perfusion well oriented x3     Assessment:  Hammertoe deformity with pressure against the adjacent metatarsals with lesion left that is very painful when pressed secondary to plantar flexion of the metatarsal     Plan:  H&P x-rays reviewed and today sharp sterile debridement of the lesion left accomplished.  I discussed orthotics to reduce plantar pressure and patient is casted for a accommodative type orthotic to reduce plantar metatarsal head pressure.  Patient will be seen back when returned  X-rays indicate patient has significant digital deformities bilateral consistent with hammertoe with cavus foot structure

## 2020-05-05 ENCOUNTER — Other Ambulatory Visit: Payer: Self-pay

## 2020-05-05 ENCOUNTER — Ambulatory Visit: Payer: Medicare PPO | Admitting: Orthotics

## 2020-05-05 ENCOUNTER — Other Ambulatory Visit: Payer: Medicare PPO | Admitting: Orthotics

## 2020-05-05 DIAGNOSIS — Q828 Other specified congenital malformations of skin: Secondary | ICD-10-CM

## 2020-05-05 DIAGNOSIS — M2042 Other hammer toe(s) (acquired), left foot: Secondary | ICD-10-CM

## 2020-05-05 NOTE — Progress Notes (Signed)
Patient came in today to pick up custom made foot orthotics.  The goals were accomplished and the patient reported no dissatisfaction with said orthotics.  Patient was advised of breakin period and how to report any issues. 

## 2020-06-21 DIAGNOSIS — Z23 Encounter for immunization: Secondary | ICD-10-CM | POA: Diagnosis not present

## 2020-06-26 DIAGNOSIS — R6881 Early satiety: Secondary | ICD-10-CM | POA: Diagnosis not present

## 2020-06-26 DIAGNOSIS — R140 Abdominal distension (gaseous): Secondary | ICD-10-CM | POA: Diagnosis not present

## 2020-06-26 DIAGNOSIS — Z9189 Other specified personal risk factors, not elsewhere classified: Secondary | ICD-10-CM | POA: Diagnosis not present

## 2020-06-26 DIAGNOSIS — Z853 Personal history of malignant neoplasm of breast: Secondary | ICD-10-CM | POA: Diagnosis not present

## 2020-07-03 DIAGNOSIS — R140 Abdominal distension (gaseous): Secondary | ICD-10-CM | POA: Diagnosis not present

## 2020-07-10 DIAGNOSIS — H43811 Vitreous degeneration, right eye: Secondary | ICD-10-CM | POA: Diagnosis not present

## 2020-08-12 DIAGNOSIS — L720 Epidermal cyst: Secondary | ICD-10-CM | POA: Diagnosis not present

## 2020-08-12 DIAGNOSIS — D1801 Hemangioma of skin and subcutaneous tissue: Secondary | ICD-10-CM | POA: Diagnosis not present

## 2020-08-12 DIAGNOSIS — L853 Xerosis cutis: Secondary | ICD-10-CM | POA: Diagnosis not present

## 2020-08-12 DIAGNOSIS — D692 Other nonthrombocytopenic purpura: Secondary | ICD-10-CM | POA: Diagnosis not present

## 2020-08-12 DIAGNOSIS — L814 Other melanin hyperpigmentation: Secondary | ICD-10-CM | POA: Diagnosis not present

## 2020-08-12 DIAGNOSIS — L821 Other seborrheic keratosis: Secondary | ICD-10-CM | POA: Diagnosis not present

## 2020-08-12 DIAGNOSIS — L918 Other hypertrophic disorders of the skin: Secondary | ICD-10-CM | POA: Diagnosis not present

## 2020-08-14 DIAGNOSIS — R140 Abdominal distension (gaseous): Secondary | ICD-10-CM | POA: Diagnosis not present

## 2020-08-19 ENCOUNTER — Other Ambulatory Visit: Payer: Self-pay

## 2020-08-19 ENCOUNTER — Ambulatory Visit: Payer: Medicare PPO | Admitting: Orthotics

## 2020-08-19 DIAGNOSIS — Q828 Other specified congenital malformations of skin: Secondary | ICD-10-CM

## 2020-08-19 DIAGNOSIS — M2041 Other hammer toe(s) (acquired), right foot: Secondary | ICD-10-CM

## 2020-08-19 DIAGNOSIS — M2042 Other hammer toe(s) (acquired), left foot: Secondary | ICD-10-CM

## 2020-08-19 NOTE — Progress Notes (Signed)
Removing met pads b/l

## 2020-09-08 DIAGNOSIS — M542 Cervicalgia: Secondary | ICD-10-CM | POA: Diagnosis not present

## 2020-09-09 ENCOUNTER — Encounter: Payer: Medicare PPO | Admitting: Orthotics

## 2020-09-11 ENCOUNTER — Ambulatory Visit: Payer: Medicare PPO | Admitting: Orthotics

## 2020-09-11 ENCOUNTER — Other Ambulatory Visit: Payer: Self-pay

## 2020-09-11 DIAGNOSIS — M2041 Other hammer toe(s) (acquired), right foot: Secondary | ICD-10-CM

## 2020-09-11 DIAGNOSIS — Q828 Other specified congenital malformations of skin: Secondary | ICD-10-CM

## 2020-09-11 NOTE — Progress Notes (Signed)
Patient picked up corrected f/o and left 2nd pair to be refurbished.

## 2020-09-12 DIAGNOSIS — H43811 Vitreous degeneration, right eye: Secondary | ICD-10-CM | POA: Diagnosis not present

## 2020-09-15 DIAGNOSIS — R140 Abdominal distension (gaseous): Secondary | ICD-10-CM | POA: Diagnosis not present

## 2020-09-15 DIAGNOSIS — K219 Gastro-esophageal reflux disease without esophagitis: Secondary | ICD-10-CM | POA: Diagnosis not present

## 2020-10-01 DIAGNOSIS — K219 Gastro-esophageal reflux disease without esophagitis: Secondary | ICD-10-CM | POA: Diagnosis not present

## 2020-10-01 DIAGNOSIS — E7800 Pure hypercholesterolemia, unspecified: Secondary | ICD-10-CM | POA: Diagnosis not present

## 2020-10-01 DIAGNOSIS — J45909 Unspecified asthma, uncomplicated: Secondary | ICD-10-CM | POA: Diagnosis not present

## 2020-10-01 DIAGNOSIS — Z0000 Encounter for general adult medical examination without abnormal findings: Secondary | ICD-10-CM | POA: Diagnosis not present

## 2020-10-01 DIAGNOSIS — J309 Allergic rhinitis, unspecified: Secondary | ICD-10-CM | POA: Diagnosis not present

## 2020-10-01 DIAGNOSIS — E559 Vitamin D deficiency, unspecified: Secondary | ICD-10-CM | POA: Diagnosis not present

## 2020-10-01 DIAGNOSIS — I10 Essential (primary) hypertension: Secondary | ICD-10-CM | POA: Diagnosis not present

## 2020-10-01 DIAGNOSIS — M150 Primary generalized (osteo)arthritis: Secondary | ICD-10-CM | POA: Diagnosis not present

## 2020-10-01 DIAGNOSIS — F320 Major depressive disorder, single episode, mild: Secondary | ICD-10-CM | POA: Diagnosis not present

## 2020-10-06 DIAGNOSIS — M25511 Pain in right shoulder: Secondary | ICD-10-CM | POA: Diagnosis not present

## 2020-10-09 ENCOUNTER — Ambulatory Visit: Payer: Medicare PPO | Admitting: Orthotics

## 2020-10-09 ENCOUNTER — Other Ambulatory Visit: Payer: Self-pay

## 2020-10-09 DIAGNOSIS — M2041 Other hammer toe(s) (acquired), right foot: Secondary | ICD-10-CM

## 2020-10-09 DIAGNOSIS — Q828 Other specified congenital malformations of skin: Secondary | ICD-10-CM

## 2020-10-09 DIAGNOSIS — M2042 Other hammer toe(s) (acquired), left foot: Secondary | ICD-10-CM

## 2020-10-09 NOTE — Progress Notes (Signed)
Picked up refurb f/o

## 2020-11-05 DIAGNOSIS — L300 Nummular dermatitis: Secondary | ICD-10-CM | POA: Diagnosis not present

## 2020-11-17 DIAGNOSIS — H26493 Other secondary cataract, bilateral: Secondary | ICD-10-CM | POA: Diagnosis not present

## 2020-11-17 DIAGNOSIS — H349 Unspecified retinal vascular occlusion: Secondary | ICD-10-CM | POA: Diagnosis not present

## 2020-11-17 DIAGNOSIS — H524 Presbyopia: Secondary | ICD-10-CM | POA: Diagnosis not present

## 2020-11-17 DIAGNOSIS — H43813 Vitreous degeneration, bilateral: Secondary | ICD-10-CM | POA: Diagnosis not present

## 2020-12-02 DIAGNOSIS — H26491 Other secondary cataract, right eye: Secondary | ICD-10-CM | POA: Diagnosis not present

## 2020-12-23 DIAGNOSIS — H26492 Other secondary cataract, left eye: Secondary | ICD-10-CM | POA: Diagnosis not present

## 2020-12-24 ENCOUNTER — Ambulatory Visit: Payer: Medicare PPO | Admitting: Podiatry

## 2020-12-24 ENCOUNTER — Other Ambulatory Visit: Payer: Self-pay

## 2020-12-24 ENCOUNTER — Encounter: Payer: Self-pay | Admitting: Podiatry

## 2020-12-24 DIAGNOSIS — M2041 Other hammer toe(s) (acquired), right foot: Secondary | ICD-10-CM

## 2020-12-24 DIAGNOSIS — M2042 Other hammer toe(s) (acquired), left foot: Secondary | ICD-10-CM | POA: Diagnosis not present

## 2020-12-24 DIAGNOSIS — Q828 Other specified congenital malformations of skin: Secondary | ICD-10-CM | POA: Diagnosis not present

## 2020-12-24 NOTE — Progress Notes (Signed)
Subjective:   Patient ID: Sonya Cantu, female   DOB: 80 y.o.   MRN: [id number]   HPI Patient presents stating that her she has lesions on the bottom of the right foot which gets sore and also has digital deformity second toe right fourth toe left that can become painful on the dorsal surface with redness and lesion formation   ROS      Objective:  Physical Exam  Several lesions bottom right that have loosened course painful when pressed along with elevated toes with rigid contracture to right for left      Assessment:  Porokeratotic lesions right along with hammertoe deformity     Plan:  H&P reviewed both conditions and for hammertoes I discussed digital fusion or tenotomy and I did apply padding to see how it does and if it continues to be this persistently sore will have to consider digital fusion.  I then went ahead debrided lesions no echogenic bleeding reappoint routine care

## 2021-01-30 ENCOUNTER — Other Ambulatory Visit: Payer: Self-pay | Admitting: Family Medicine

## 2021-01-30 DIAGNOSIS — Z1231 Encounter for screening mammogram for malignant neoplasm of breast: Secondary | ICD-10-CM

## 2021-03-26 ENCOUNTER — Other Ambulatory Visit: Payer: Self-pay

## 2021-03-26 ENCOUNTER — Ambulatory Visit
Admission: RE | Admit: 2021-03-26 | Discharge: 2021-03-26 | Disposition: A | Discharge status: Home / Self Care | Payer: Medicare PPO | Source: Ambulatory Visit | Attending: Family Medicine | Admitting: Family Medicine

## 2021-03-26 DIAGNOSIS — Z1231 Encounter for screening mammogram for malignant neoplasm of breast: Secondary | ICD-10-CM

## 2021-03-31 DIAGNOSIS — J309 Allergic rhinitis, unspecified: Secondary | ICD-10-CM | POA: Diagnosis not present

## 2021-03-31 DIAGNOSIS — E7800 Pure hypercholesterolemia, unspecified: Secondary | ICD-10-CM | POA: Diagnosis not present

## 2021-03-31 DIAGNOSIS — M150 Primary generalized (osteo)arthritis: Secondary | ICD-10-CM | POA: Diagnosis not present

## 2021-03-31 DIAGNOSIS — J45909 Unspecified asthma, uncomplicated: Secondary | ICD-10-CM | POA: Diagnosis not present

## 2021-03-31 DIAGNOSIS — E559 Vitamin D deficiency, unspecified: Secondary | ICD-10-CM | POA: Diagnosis not present

## 2021-03-31 DIAGNOSIS — K219 Gastro-esophageal reflux disease without esophagitis: Secondary | ICD-10-CM | POA: Diagnosis not present

## 2021-03-31 DIAGNOSIS — F320 Major depressive disorder, single episode, mild: Secondary | ICD-10-CM | POA: Diagnosis not present

## 2021-03-31 DIAGNOSIS — I10 Essential (primary) hypertension: Secondary | ICD-10-CM | POA: Diagnosis not present

## 2021-06-13 DIAGNOSIS — Z23 Encounter for immunization: Secondary | ICD-10-CM | POA: Diagnosis not present

## 2021-06-23 DIAGNOSIS — F324 Major depressive disorder, single episode, in partial remission: Secondary | ICD-10-CM | POA: Diagnosis not present

## 2021-06-23 DIAGNOSIS — G8929 Other chronic pain: Secondary | ICD-10-CM | POA: Diagnosis not present

## 2021-06-23 DIAGNOSIS — K219 Gastro-esophageal reflux disease without esophagitis: Secondary | ICD-10-CM | POA: Diagnosis not present

## 2021-06-23 DIAGNOSIS — F419 Anxiety disorder, unspecified: Secondary | ICD-10-CM | POA: Diagnosis not present

## 2021-06-23 DIAGNOSIS — H3530 Unspecified macular degeneration: Secondary | ICD-10-CM | POA: Diagnosis not present

## 2021-06-23 DIAGNOSIS — E785 Hyperlipidemia, unspecified: Secondary | ICD-10-CM | POA: Diagnosis not present

## 2021-06-23 DIAGNOSIS — J45909 Unspecified asthma, uncomplicated: Secondary | ICD-10-CM | POA: Diagnosis not present

## 2021-06-23 DIAGNOSIS — I10 Essential (primary) hypertension: Secondary | ICD-10-CM | POA: Diagnosis not present

## 2021-06-23 DIAGNOSIS — I739 Peripheral vascular disease, unspecified: Secondary | ICD-10-CM | POA: Diagnosis not present

## 2021-06-29 DIAGNOSIS — Z01419 Encounter for gynecological examination (general) (routine) without abnormal findings: Secondary | ICD-10-CM | POA: Diagnosis not present

## 2021-06-29 DIAGNOSIS — Z853 Personal history of malignant neoplasm of breast: Secondary | ICD-10-CM | POA: Diagnosis not present

## 2021-07-06 DIAGNOSIS — J45909 Unspecified asthma, uncomplicated: Secondary | ICD-10-CM | POA: Diagnosis not present

## 2021-07-06 DIAGNOSIS — J309 Allergic rhinitis, unspecified: Secondary | ICD-10-CM | POA: Diagnosis not present

## 2021-07-06 DIAGNOSIS — R059 Cough, unspecified: Secondary | ICD-10-CM | POA: Diagnosis not present

## 2021-08-17 DIAGNOSIS — D692 Other nonthrombocytopenic purpura: Secondary | ICD-10-CM | POA: Diagnosis not present

## 2021-08-17 DIAGNOSIS — D1801 Hemangioma of skin and subcutaneous tissue: Secondary | ICD-10-CM | POA: Diagnosis not present

## 2021-08-17 DIAGNOSIS — L298 Other pruritus: Secondary | ICD-10-CM | POA: Diagnosis not present

## 2021-08-17 DIAGNOSIS — L918 Other hypertrophic disorders of the skin: Secondary | ICD-10-CM | POA: Diagnosis not present

## 2021-08-17 DIAGNOSIS — L821 Other seborrheic keratosis: Secondary | ICD-10-CM | POA: Diagnosis not present

## 2021-08-17 DIAGNOSIS — L814 Other melanin hyperpigmentation: Secondary | ICD-10-CM | POA: Diagnosis not present

## 2021-09-23 DIAGNOSIS — Z86010 Personal history of colonic polyps: Secondary | ICD-10-CM | POA: Diagnosis not present

## 2021-09-23 DIAGNOSIS — K219 Gastro-esophageal reflux disease without esophagitis: Secondary | ICD-10-CM | POA: Diagnosis not present

## 2021-10-14 DIAGNOSIS — D649 Anemia, unspecified: Secondary | ICD-10-CM | POA: Diagnosis not present

## 2021-10-28 ENCOUNTER — Other Ambulatory Visit: Payer: Self-pay | Admitting: Orthopedic Surgery

## 2021-10-28 DIAGNOSIS — M542 Cervicalgia: Secondary | ICD-10-CM | POA: Diagnosis not present

## 2021-10-28 DIAGNOSIS — M25511 Pain in right shoulder: Secondary | ICD-10-CM | POA: Diagnosis not present

## 2021-10-29 ENCOUNTER — Ambulatory Visit
Admission: RE | Admit: 2021-10-29 | Discharge: 2021-10-29 | Disposition: A | Discharge status: Home / Self Care | Payer: Medicare PPO | Source: Ambulatory Visit | Attending: Orthopedic Surgery | Admitting: Orthopedic Surgery

## 2021-10-29 ENCOUNTER — Other Ambulatory Visit: Payer: Self-pay

## 2021-10-29 DIAGNOSIS — M2578 Osteophyte, vertebrae: Secondary | ICD-10-CM | POA: Diagnosis not present

## 2021-10-29 DIAGNOSIS — M4802 Spinal stenosis, cervical region: Secondary | ICD-10-CM | POA: Diagnosis not present

## 2021-10-29 DIAGNOSIS — M542 Cervicalgia: Secondary | ICD-10-CM

## 2021-11-03 DIAGNOSIS — E559 Vitamin D deficiency, unspecified: Secondary | ICD-10-CM | POA: Diagnosis not present

## 2021-11-03 DIAGNOSIS — F320 Major depressive disorder, single episode, mild: Secondary | ICD-10-CM | POA: Diagnosis not present

## 2021-11-03 DIAGNOSIS — K219 Gastro-esophageal reflux disease without esophagitis: Secondary | ICD-10-CM | POA: Diagnosis not present

## 2021-11-03 DIAGNOSIS — Z0000 Encounter for general adult medical examination without abnormal findings: Secondary | ICD-10-CM | POA: Diagnosis not present

## 2021-11-03 DIAGNOSIS — M150 Primary generalized (osteo)arthritis: Secondary | ICD-10-CM | POA: Diagnosis not present

## 2021-11-03 DIAGNOSIS — J45909 Unspecified asthma, uncomplicated: Secondary | ICD-10-CM | POA: Diagnosis not present

## 2021-11-03 DIAGNOSIS — E7800 Pure hypercholesterolemia, unspecified: Secondary | ICD-10-CM | POA: Diagnosis not present

## 2021-11-03 DIAGNOSIS — G609 Hereditary and idiopathic neuropathy, unspecified: Secondary | ICD-10-CM | POA: Diagnosis not present

## 2021-11-03 DIAGNOSIS — I10 Essential (primary) hypertension: Secondary | ICD-10-CM | POA: Diagnosis not present

## 2021-11-03 DIAGNOSIS — J309 Allergic rhinitis, unspecified: Secondary | ICD-10-CM | POA: Diagnosis not present

## 2021-11-17 DIAGNOSIS — M791 Myalgia, unspecified site: Secondary | ICD-10-CM | POA: Diagnosis not present

## 2021-11-17 DIAGNOSIS — M25511 Pain in right shoulder: Secondary | ICD-10-CM | POA: Diagnosis not present

## 2021-11-25 DIAGNOSIS — M5412 Radiculopathy, cervical region: Secondary | ICD-10-CM | POA: Diagnosis not present

## 2021-11-25 DIAGNOSIS — M542 Cervicalgia: Secondary | ICD-10-CM | POA: Diagnosis not present

## 2021-11-25 DIAGNOSIS — M47892 Other spondylosis, cervical region: Secondary | ICD-10-CM | POA: Diagnosis not present

## 2022-01-05 DIAGNOSIS — M47895 Other spondylosis, thoracolumbar region: Secondary | ICD-10-CM | POA: Diagnosis not present

## 2022-01-12 DIAGNOSIS — H52203 Unspecified astigmatism, bilateral: Secondary | ICD-10-CM | POA: Diagnosis not present

## 2022-01-12 DIAGNOSIS — H348322 Tributary (branch) retinal vein occlusion, left eye, stable: Secondary | ICD-10-CM | POA: Diagnosis not present

## 2022-01-12 DIAGNOSIS — Z961 Presence of intraocular lens: Secondary | ICD-10-CM | POA: Diagnosis not present

## 2022-01-12 DIAGNOSIS — H43813 Vitreous degeneration, bilateral: Secondary | ICD-10-CM | POA: Diagnosis not present

## 2022-02-15 ENCOUNTER — Other Ambulatory Visit: Payer: Self-pay | Admitting: Family Medicine

## 2022-02-15 DIAGNOSIS — Z1231 Encounter for screening mammogram for malignant neoplasm of breast: Secondary | ICD-10-CM

## 2022-02-24 DIAGNOSIS — H6122 Impacted cerumen, left ear: Secondary | ICD-10-CM | POA: Diagnosis not present

## 2022-03-29 ENCOUNTER — Ambulatory Visit
Admission: RE | Admit: 2022-03-29 | Discharge: 2022-03-29 | Disposition: A | Discharge status: Home / Self Care | Payer: Medicare PPO | Source: Ambulatory Visit | Attending: Family Medicine | Admitting: Family Medicine

## 2022-03-29 DIAGNOSIS — Z1231 Encounter for screening mammogram for malignant neoplasm of breast: Secondary | ICD-10-CM

## 2022-03-29 HISTORY — DX: Personal history of irradiation: Z92.3

## 2022-04-06 DIAGNOSIS — K219 Gastro-esophageal reflux disease without esophagitis: Secondary | ICD-10-CM | POA: Diagnosis not present

## 2022-04-06 DIAGNOSIS — E559 Vitamin D deficiency, unspecified: Secondary | ICD-10-CM | POA: Diagnosis not present

## 2022-04-06 DIAGNOSIS — F3341 Major depressive disorder, recurrent, in partial remission: Secondary | ICD-10-CM | POA: Diagnosis not present

## 2022-04-06 DIAGNOSIS — I10 Essential (primary) hypertension: Secondary | ICD-10-CM | POA: Diagnosis not present

## 2022-04-06 DIAGNOSIS — Z809 Family history of malignant neoplasm, unspecified: Secondary | ICD-10-CM | POA: Diagnosis not present

## 2022-04-06 DIAGNOSIS — F419 Anxiety disorder, unspecified: Secondary | ICD-10-CM | POA: Diagnosis not present

## 2022-04-06 DIAGNOSIS — J45909 Unspecified asthma, uncomplicated: Secondary | ICD-10-CM | POA: Diagnosis not present

## 2022-04-06 DIAGNOSIS — Z7951 Long term (current) use of inhaled steroids: Secondary | ICD-10-CM | POA: Diagnosis not present

## 2022-04-06 DIAGNOSIS — M1990 Unspecified osteoarthritis, unspecified site: Secondary | ICD-10-CM | POA: Diagnosis not present

## 2022-04-07 ENCOUNTER — Ambulatory Visit (INDEPENDENT_AMBULATORY_CARE_PROVIDER_SITE_OTHER): Payer: Medicare PPO

## 2022-04-07 ENCOUNTER — Encounter: Payer: Self-pay | Admitting: Podiatry

## 2022-04-07 ENCOUNTER — Ambulatory Visit: Payer: Medicare PPO | Admitting: Podiatry

## 2022-04-07 DIAGNOSIS — M79671 Pain in right foot: Secondary | ICD-10-CM | POA: Diagnosis not present

## 2022-04-07 DIAGNOSIS — M2042 Other hammer toe(s) (acquired), left foot: Secondary | ICD-10-CM | POA: Diagnosis not present

## 2022-04-07 DIAGNOSIS — M2041 Other hammer toe(s) (acquired), right foot: Secondary | ICD-10-CM

## 2022-04-07 DIAGNOSIS — Q828 Other specified congenital malformations of skin: Secondary | ICD-10-CM | POA: Diagnosis not present

## 2022-04-07 DIAGNOSIS — G8929 Other chronic pain: Secondary | ICD-10-CM | POA: Diagnosis not present

## 2022-04-07 NOTE — Progress Notes (Signed)
Subjective:   Patient ID: Sonya Cantu, female   DOB: 81 y.o.   MRN: [id number]   HPI Patient presents today with digital deformity second right wondering about surgery and chronic plantar callus formation plantar aspect right foot.  States that it gets sore but it is more the way it fits with a sandal   ROS      Objective:  Physical Exam  Neurovascular status found to be intact muscle strength was found to be adequate range of motion adequate with patient found to have a elevated rigidly contracted second digit right that is slightly over the big toe but not significantly.  Also noted to have a lesion on the plantar aspect of the right of the right plantar foot with a lucent core that is moderately tender when palpated     Assessment:  Hammertoe deformity digit to right with rigid contracture along with probable porokeratotic lesion plantar right     Plan:  H&P reviewed condition and educated her on the digital deformity discussing digital fusion of the second toe.  I do think this may be necessary but we will get a hold off and consider this in the fall.  At this point I did sharp sterile debridement of the lesion on the second metatarsal plantar and on the plantar surface no angiogenic bleeding and we will see how this does and can decide what else may be of advantages for her  X-rays indicate there is rigid contracture of the second digit right foot with mild arthritis of the inner phalangeal joint

## 2022-04-12 DIAGNOSIS — R252 Cramp and spasm: Secondary | ICD-10-CM | POA: Diagnosis not present

## 2022-04-12 DIAGNOSIS — R1110 Vomiting, unspecified: Secondary | ICD-10-CM | POA: Diagnosis not present

## 2022-04-13 ENCOUNTER — Ambulatory Visit (HOSPITAL_COMMUNITY)
Admission: EM | Admit: 2022-04-13 | Discharge: 2022-04-13 | Disposition: A | Discharge status: Home / Self Care | Payer: Medicare PPO | Attending: Family Medicine | Admitting: Family Medicine

## 2022-04-13 ENCOUNTER — Encounter (HOSPITAL_COMMUNITY): Payer: Self-pay | Admitting: Emergency Medicine

## 2022-04-13 ENCOUNTER — Inpatient Hospital Stay (HOSPITAL_BASED_OUTPATIENT_CLINIC_OR_DEPARTMENT_OTHER)
Admission: EM | Admit: 2022-04-13 | Discharge: 2022-04-15 | DRG: 641 | Disposition: A | Discharge status: Home Health | Payer: Medicare PPO | Attending: Family Medicine | Admitting: Family Medicine

## 2022-04-13 ENCOUNTER — Other Ambulatory Visit: Payer: Self-pay

## 2022-04-13 ENCOUNTER — Encounter (HOSPITAL_BASED_OUTPATIENT_CLINIC_OR_DEPARTMENT_OTHER): Payer: Self-pay

## 2022-04-13 DIAGNOSIS — Z885 Allergy status to narcotic agent status: Secondary | ICD-10-CM

## 2022-04-13 DIAGNOSIS — Z79899 Other long term (current) drug therapy: Secondary | ICD-10-CM

## 2022-04-13 DIAGNOSIS — R5383 Other fatigue: Secondary | ICD-10-CM

## 2022-04-13 DIAGNOSIS — Z881 Allergy status to other antibiotic agents status: Secondary | ICD-10-CM | POA: Diagnosis not present

## 2022-04-13 DIAGNOSIS — N179 Acute kidney failure, unspecified: Secondary | ICD-10-CM

## 2022-04-13 DIAGNOSIS — Z923 Personal history of irradiation: Secondary | ICD-10-CM

## 2022-04-13 DIAGNOSIS — N3000 Acute cystitis without hematuria: Secondary | ICD-10-CM

## 2022-04-13 DIAGNOSIS — J45909 Unspecified asthma, uncomplicated: Secondary | ICD-10-CM | POA: Diagnosis present

## 2022-04-13 DIAGNOSIS — D72819 Decreased white blood cell count, unspecified: Secondary | ICD-10-CM | POA: Diagnosis present

## 2022-04-13 DIAGNOSIS — E860 Dehydration: Secondary | ICD-10-CM | POA: Diagnosis not present

## 2022-04-13 DIAGNOSIS — E871 Hypo-osmolality and hyponatremia: Secondary | ICD-10-CM

## 2022-04-13 DIAGNOSIS — Z888 Allergy status to other drugs, medicaments and biological substances status: Secondary | ICD-10-CM | POA: Diagnosis not present

## 2022-04-13 DIAGNOSIS — I951 Orthostatic hypotension: Secondary | ICD-10-CM | POA: Diagnosis present

## 2022-04-13 DIAGNOSIS — R001 Bradycardia, unspecified: Secondary | ICD-10-CM | POA: Diagnosis present

## 2022-04-13 DIAGNOSIS — Z853 Personal history of malignant neoplasm of breast: Secondary | ICD-10-CM

## 2022-04-13 DIAGNOSIS — D696 Thrombocytopenia, unspecified: Secondary | ICD-10-CM | POA: Diagnosis present

## 2022-04-13 DIAGNOSIS — I10 Essential (primary) hypertension: Secondary | ICD-10-CM | POA: Diagnosis present

## 2022-04-13 DIAGNOSIS — Z803 Family history of malignant neoplasm of breast: Secondary | ICD-10-CM

## 2022-04-13 DIAGNOSIS — Z880 Allergy status to penicillin: Secondary | ICD-10-CM | POA: Diagnosis not present

## 2022-04-13 DIAGNOSIS — F32A Depression, unspecified: Secondary | ICD-10-CM | POA: Diagnosis present

## 2022-04-13 HISTORY — DX: Essential (primary) hypertension: I10

## 2022-04-13 LAB — COMPREHENSIVE METABOLIC PANEL WITH GFR
ALT: 15 U/L (ref 0–44)
AST: 20 U/L (ref 15–41)
Albumin: 4.5 g/dL (ref 3.5–5.0)
Alkaline Phosphatase: 77 U/L (ref 38–126)
Anion gap: 13 (ref 5–15)
BUN: 28 mg/dL — ABNORMAL HIGH (ref 8–23)
CO2: 23 mmol/L (ref 22–32)
Calcium: 10 mg/dL (ref 8.9–10.3)
Chloride: 85 mmol/L — ABNORMAL LOW (ref 98–111)
Creatinine, Ser: 1.27 mg/dL — ABNORMAL HIGH (ref 0.44–1.00)
GFR, Estimated: 42 mL/min — ABNORMAL LOW
Glucose, Bld: 94 mg/dL (ref 70–99)
Potassium: 3.6 mmol/L (ref 3.5–5.1)
Sodium: 121 mmol/L — ABNORMAL LOW (ref 135–145)
Total Bilirubin: 0.8 mg/dL (ref 0.3–1.2)
Total Protein: 6.8 g/dL (ref 6.5–8.1)

## 2022-04-13 LAB — URINALYSIS, ROUTINE W REFLEX MICROSCOPIC
Bilirubin Urine: NEGATIVE
Glucose, UA: NEGATIVE mg/dL
Hgb urine dipstick: NEGATIVE
Ketones, ur: NEGATIVE mg/dL
Nitrite: POSITIVE — AB
Protein, ur: NEGATIVE mg/dL
Specific Gravity, Urine: 1.006 (ref 1.005–1.030)
pH: 5.5 (ref 5.0–8.0)

## 2022-04-13 LAB — CBC
HCT: 37.9 % (ref 36.0–46.0)
Hemoglobin: 13.8 g/dL (ref 12.0–15.0)
MCH: 32.2 pg (ref 26.0–34.0)
MCHC: 36.4 g/dL — ABNORMAL HIGH (ref 30.0–36.0)
MCV: 88.6 fL (ref 80.0–100.0)
Platelets: 140 10*3/uL — ABNORMAL LOW (ref 150–400)
RBC: 4.28 MIL/uL (ref 3.87–5.11)
RDW: 11.8 % (ref 11.5–15.5)
WBC: 3.2 10*3/uL — ABNORMAL LOW (ref 4.0–10.5)
nRBC: 0 % (ref 0.0–0.2)

## 2022-04-13 LAB — LIPASE, BLOOD: Lipase: 128 U/L — ABNORMAL HIGH (ref 11–51)

## 2022-04-13 LAB — CK: Total CK: 108 U/L (ref 38–234)

## 2022-04-13 MED ORDER — SODIUM CHLORIDE 0.9 % IV BOLUS
1000.0000 mL | Freq: Once | INTRAVENOUS | Status: AC
  Administered 2022-04-13: 1000 mL via INTRAVENOUS

## 2022-04-13 MED ORDER — SODIUM CHLORIDE 0.9 % IV SOLN
1.0000 g | Freq: Once | INTRAVENOUS | Status: AC
  Administered 2022-04-13: 1 g via INTRAVENOUS
  Filled 2022-04-13: qty 10

## 2022-04-13 NOTE — ED Triage Notes (Signed)
Patient here POV from Home.  Endorses having Episodes of N/V approximately 6 Days ago. States Recent Contact with COVID-19 Positive Individuals recently and states she tested Negative at Home.   Seen by PCP and had Blood Specimens collected. Instructed to Seek ED Evaluation for Hyponatremia and AKI.  NAD Noted during Triage. A&Ox4. GCS 15. Ambulatory.

## 2022-04-13 NOTE — ED Provider Notes (Signed)
[facility]    CSN: [id number] Arrival date & time: 04/13/22  1619      History   Chief Complaint Chief Complaint  Patient presents with   Fatigue    HPI Sonya Cantu is a 81 y.o. female.   Patient presents to urgent care for evaluation after her primary care provider called her to tell her that her kidney function and her sodium levels are abnormal.  Primary care provider recommended that patient go to the emergency room or the urgent care.  Patient states that she was initially seen by her PCP due to nausea and vomiting from Wednesday, July 12 through Saturday, July 15.  Patient also had 1 episode of diarrhea during that timeframe.  She has been able to keep down food and liquid since Saturday, July 15 and has not had an episode of emesis since then.  She denies abdominal pain, nausea, dizziness, headache, ear pain, sore throat, chest pain, shortness of breath, and fever/chills.  She states that she "just feels very dehydrated and needs to get her strength back".  She has been using Zofran antinausea medication prescribed by her PCP to help with her symptoms initially.  She continued to feel ill and called her PCP for an appointment who instructed her to increase her water intake and obtained lab work.  Sodium level is 127 and creatinine is 1.77 per BMP from yesterday.      Past Medical History:  Diagnosis Date   Personal history of radiation therapy     There are no problems to display for this patient.   Past Surgical History:  Procedure Laterality Date   BREAST BIOPSY Right 12/03/2003   Malignant Core    BREAST LUMPECTOMY Right 2005    OB History   No obstetric history on file.      Home Medications    Prior to Admission medications   Medication Sig Start Date End Date Taking? Authorizing Provider  ALPRAZolam Prudy Feeler) 0.25 MG tablet  03/06/20   [provider]  Azelastine HCl 0.15 % SOLN  10/31/19   [provider]  DILT-XR 240 MG 24  hr capsule  01/21/20   [provider]  FLOVENT [facility] 44 MCG/ACT inhaler  03/17/20   [provider]  fluticasone Aleda Grana) 50 MCG/ACT nasal spray  12/24/19   [provider]  gemfibrozil (LOPID) 600 MG tablet  03/17/20   [provider]  hydrochlorothiazide (HYDRODIURIL) 12.5 MG tablet  03/17/20   [provider]  Spacer/Aero-Holding Deretha [facility] DIAMOND) MISC  12/20/19   [provider]  telmisartan (MICARDIS) 40 MG tablet  03/17/20   [provider]  venlafaxine XR (EFFEXOR-XR) 37.5 MG 24 hr capsule  03/20/20   [provider]  Vitamin D, Ergocalciferol, (DRISDOL) 1.25 MG (50000 UNIT) CAPS capsule  02/14/20   [provider]    Family History Family History  Problem Relation Age of Onset   Breast cancer Maternal Grandmother     Social History Social History   Tobacco Use   Smoking status: Never   Smokeless tobacco: Never  Substance Use Topics   Alcohol use: Not Currently   Drug use: Never     Allergies   Amoxicillin, Clindamycin/lincomycin, Codeine, and Statins   Review of Systems Review of Systems Per HPI  Physical Exam Triage Vital Signs ED Triage Vitals  Enc Vitals Group     BP 04/13/22 1659 (!) 165/65     Pulse Rate 04/13/22 1659 (!) 47  Resp 04/13/22 1659 18     Temp 04/13/22 1659 98 F (36.7 C)     Temp Source 04/13/22 1659 Oral     SpO2 04/13/22 1659 95 %     Weight --      Height --      Head Circumference --      Peak Flow --      Pain Score 04/13/22 1658 0     Pain Loc --      Pain Edu? --      Excl. in GC? --    No data found.  Updated Vital Signs BP (!) 165/65 (BP Location: Left Arm)   Pulse (!) 47   Temp 98 F (36.7 C) (Oral)   Resp 18   SpO2 95%   Visual Acuity Right Eye Distance:   Left Eye Distance:   Bilateral Distance:    Right Eye Near:   Left Eye Near:    Bilateral Near:     Physical Exam Vitals and nursing note reviewed.   Constitutional:      Appearance: Normal appearance. She is not ill-appearing or toxic-appearing.     Comments: Very pleasant patient sitting on exam in position of comfort table in no acute distress.   HENT:     Head: Normocephalic and atraumatic.     Right Ear: Hearing and external ear normal.     Left Ear: Hearing and external ear normal.     Nose: Nose normal.     Mouth/Throat:     Lips: Pink.     Mouth: Mucous membranes are dry.     Pharynx: Oropharynx is clear.  Eyes:     General: Lids are normal. Vision grossly intact. Gaze aligned appropriately.     Extraocular Movements: Extraocular movements intact.     Conjunctiva/sclera: Conjunctivae normal.  Cardiovascular:     Rate and Rhythm: Normal rate and regular rhythm.     Heart sounds: Normal heart sounds, S1 normal and S2 normal.  Pulmonary:     Effort: Pulmonary effort is normal. No respiratory distress.     Breath sounds: Normal breath sounds and air entry.  Abdominal:     General: Bowel sounds are normal.     Palpations: Abdomen is soft.     Tenderness: There is no abdominal tenderness. There is no right CVA tenderness, left CVA tenderness or guarding.  Musculoskeletal:     Cervical back: Neck supple.  Skin:    General: Skin is warm and dry.     Capillary Refill: Capillary refill takes less than 2 seconds.     Findings: No rash.  Neurological:     General: No focal deficit present.     Mental Status: She is alert and oriented to person, place, and time. Mental status is at baseline.     Cranial Nerves: No dysarthria or facial asymmetry.     Gait: Gait is intact.  Psychiatric:        Mood and Affect: Mood normal.        Speech: Speech normal.        Behavior: Behavior normal.        Thought Content: Thought content normal.        Judgment: Judgment normal.      UC Treatments / Results  Labs (all labs ordered are listed, but only abnormal results are displayed) Labs Reviewed - No data to  display  EKG   Radiology No results found.  Procedures Procedures (including critical care  time)  Medications Ordered in UC Medications - No data to display  Initial Impression / Assessment and Plan / UC Course  I have reviewed the triage vital signs and the nursing notes.  Pertinent labs & imaging results that were available during my care of the patient were reviewed by me and considered in my medical decision making (see chart for details).  1.  Hyponatremia and dehydration Recommend patient go to the nearest emergency department for evaluation and likely IV fluids and recheck of labs due to hyponatremia and possible acute kidney injury related to recent vomiting and diarrhea episodes. Patient appears dehydrated to physical exam. Vital signs are hemodynamically stable at this time. Heart rate is in the upper 40s at 47 and regular. Patient does not appear to be symptomatic from this and continues to deny dizziness, chest pain, shortness of breath, and lightheadedness. Patient is stable to go by personal vehicle as she is hemodynamically and neurologically stable at this time. Discussed risk of deferring ER visit to which patient verbalizes understanding and agreement with plan. Patient discharged from urgent care in stable condition.   Final Clinical Impressions(s) / UC Diagnoses   Final diagnoses:  Hyponatremia  Dehydration  Other fatigue     Discharge Instructions      Please go to the nearest emergency department for further evaluation.  Your lab work shows that you are very dehydrated and would benefit from some IV fluids and a recheck of your labs to make sure that your kidney function and your sodium go back to normal.    ED Prescriptions   None    PDMP not reviewed this encounter.   Carlisle Beers, [place] 04/15/22 1439

## 2022-04-13 NOTE — ED Provider Notes (Signed)
[facility] EMERGENCY DEPT Provider Note   CSN: [id number] Arrival date & time: 04/13/22  1818     History  Chief Complaint  Patient presents with   Emesis    Sonya Cantu is a 81 y.o. female.  Patient is a 81 year old who presents with generalized weakness.  She had an episode of nausea and vomiting with dry heaves that lasted 3 to 4 days last week.  She has not had any nausea or vomiting in the last 4 days.  She denies any abdominal pain.  No fevers.  No cough or cold symptoms.  She just feels like she is still very weak.  She is having some muscle spasms of her lower extremities.  She went to her PCPs office and had some blood work done yesterday.  She was called today and said that her sodium level was low and her kidney function was not normal so she needed some IV fluids.  She initially went to urgent care and was sent here for further evaluation.       Home Medications Prior to Admission medications   Medication Sig Start Date End Date Taking? Authorizing Provider  ALPRAZolam Prudy Feeler) 0.25 MG tablet  03/06/20   [provider]  Azelastine HCl 0.15 % SOLN  10/31/19   [provider]  DILT-XR 240 MG 24 hr capsule  01/21/20   [provider]  FLOVENT [facility] 44 MCG/ACT inhaler  03/17/20   [provider]  fluticasone Aleda Grana) 50 MCG/ACT nasal spray  12/24/19   [provider]  gemfibrozil (LOPID) 600 MG tablet  03/17/20   [provider]  hydrochlorothiazide (HYDRODIURIL) 12.5 MG tablet  03/17/20   [provider]  Spacer/Aero-Holding Deretha [facility] DIAMOND) MISC  12/20/19   [provider]  telmisartan (MICARDIS) 40 MG tablet  03/17/20   [provider]  venlafaxine XR (EFFEXOR-XR) 37.5 MG 24 hr capsule  03/20/20   [provider]  Vitamin D, Ergocalciferol, (DRISDOL) 1.25 MG (50000 UNIT) CAPS capsule  02/14/20   [provider]      Allergies    Amoxicillin,  Clindamycin/lincomycin, Codeine, and Statins    Review of Systems   Review of Systems  Constitutional:  Positive for fatigue. Negative for chills, diaphoresis and fever.  HENT:  Negative for congestion, rhinorrhea and sneezing.   Eyes: Negative.   Respiratory:  Negative for cough, chest tightness and shortness of breath.   Cardiovascular:  Negative for chest pain and leg swelling.  Gastrointestinal:  Negative for abdominal pain, blood in stool, diarrhea, nausea and vomiting.  Genitourinary:  Negative for difficulty urinating, flank pain, frequency and hematuria.  Musculoskeletal:  Positive for myalgias. Negative for arthralgias and back pain.  Skin:  Negative for rash.  Neurological:  Positive for weakness (Generalized). Negative for dizziness, speech difficulty, numbness and headaches.    Physical Exam Updated Vital Signs BP (!) 188/92   Pulse (!) 55   Temp 97.9 F (36.6 C) (Oral)   Resp 19   Ht 5\' 5"  (1.651 m)   Wt 68 kg   SpO2 99%   BMI 24.96 kg/m  Physical Exam Constitutional:      Appearance: She is well-developed.  HENT:     Head: Normocephalic and atraumatic.  Eyes:     Pupils: Pupils are equal, round, and reactive to light.  Cardiovascular:     Rate and Rhythm: Normal rate and regular rhythm.     Heart sounds: Normal heart sounds.  Pulmonary:  Effort: Pulmonary effort is normal. No respiratory distress.     Breath sounds: Normal breath sounds. No wheezing or rales.  Chest:     Chest wall: No tenderness.  Abdominal:     General: Bowel sounds are normal.     Palpations: Abdomen is soft.     Tenderness: There is no abdominal tenderness. There is no guarding or rebound.  Musculoskeletal:        General: Normal range of motion.     Cervical back: Normal range of motion and neck supple.  Lymphadenopathy:     Cervical: No cervical adenopathy.  Skin:    General: Skin is warm and dry.     Findings: No rash.  Neurological:     Mental Status: She is alert and  oriented to person, place, and time.     ED Results / Procedures / Treatments   Labs (all labs ordered are listed, but only abnormal results are displayed) Labs Reviewed  LIPASE, BLOOD - Abnormal; Notable for the following components:      Result Value   Lipase 128 (*)    All other components within normal limits  COMPREHENSIVE METABOLIC PANEL - Abnormal; Notable for the following components:   Sodium 121 (*)    Chloride 85 (*)    BUN 28 (*)    Creatinine, Ser 1.27 (*)    GFR, Estimated 42 (*)    All other components within normal limits  CBC - Abnormal; Notable for the following components:   WBC 3.2 (*)    MCHC 36.4 (*)    Platelets 140 (*)    All other components within normal limits  URINALYSIS, ROUTINE W REFLEX MICROSCOPIC - Abnormal; Notable for the following components:   Nitrite POSITIVE (*)    Leukocytes,Ua MODERATE (*)    All other components within normal limits  URINE CULTURE  CK    EKG None  Radiology No results found.  Procedures Procedures    Medications Ordered in ED Medications  sodium chloride 0.9 % bolus 1,000 mL (0 mLs Intravenous Stopped 04/13/22 2028)  cefTRIAXone (ROCEPHIN) 1 g in sodium chloride 0.9 % 100 mL IVPB (1 g Intravenous New Bag/Given 04/13/22 2035)    ED Course/ Medical Decision Making/ A&P                           Medical Decision Making Amount and/or Complexity of Data Reviewed Labs: ordered.  Risk Decision regarding hospitalization.   Patient is a 81 year old female who presents with some generalized weakness.  This is in association with some nausea and vomiting that occurred last week.  She feels dehydrated.  No fever.  No suggestions of sepsis.  Her blood pressure is stable.  Her heart rate is a little bit on the low side in the 50s but her blood pressures remained stable.  She does not seem symptomatic from this.  Lab work was performed which shows a marked hyponatremia with a sodium of 121.  She was gently hydrated  with normal saline.  Her urine is consistent with infection.  She was started on Rocephin.  Her creatinine is elevated at 1.27 which is higher than her prior values per chart review.  Her lipase is minimally elevated but she does not have any associated pain in her abdomen.  LFTs are within normal limits.  Doubt gallbladder disease.  This is likely residual from the prior vomiting.  Her CK is normal without evidence of rhabdomyolysis.  I spoke with Dr. Margo Aye who will admit the patient to the hospitalist service for further treatment.  CRITICAL CARE Performed by: Rolan Bucco Total critical care time: 70 minutes Critical care time was exclusive of separately billable procedures and treating other patients. Critical care was necessary to treat or prevent imminent or life-threatening deterioration. Critical care was time spent personally by me on the following activities: development of treatment plan with patient and/or surrogate as well as nursing, discussions with consultants, evaluation of patient's response to treatment, examination of patient, obtaining history from patient or surrogate, ordering and performing treatments and interventions, ordering and review of laboratory studies, ordering and review of radiographic studies, pulse oximetry and re-evaluation of patient's condition.   Final Clinical Impression(s) / ED Diagnoses Final diagnoses:  Hyponatremia  Acute cystitis without hematuria    Rx / DC Orders ED Discharge Orders     None         Rolan Bucco, MD 04/13/22 2235

## 2022-04-13 NOTE — Discharge Instructions (Signed)
Please go to the nearest emergency department for further evaluation.  Your lab work shows that you are very dehydrated and would benefit from some IV fluids and a recheck of your labs to make sure that your kidney function and your sodium go back to normal.

## 2022-04-13 NOTE — ED Triage Notes (Signed)
Pt reports that she had n/v last Wed-Friday. Her PCP called her in nausea medications but reports didn't help, so quit taking.  Pt saw PCP yesterday since having fatigue and leg issues at night. Reports that had lab work done yesterday at PCP office. Was called today telling that sodium and kidney counts were low and probably dehydrated so pt needed to go to ED or UC. Pt reports has follow up appt on Thursday with PCP.

## 2022-04-13 NOTE — ED Notes (Signed)
Patient is being discharged from the Urgent Care and sent to the Emergency Department via POV . Per Marcelino Duster NP, patient is in need of higher level of care due to hyponatremia and dehydration. Patient is aware and verbalizes understanding of plan of care.  Vitals:   04/13/22 1659  BP: (!) 165/65  Pulse: (!) 47  Resp: 18  Temp: 98 F (36.7 C)  SpO2: 95%

## 2022-04-14 ENCOUNTER — Ambulatory Visit: Payer: Medicare PPO | Admitting: Podiatry

## 2022-04-14 ENCOUNTER — Encounter (HOSPITAL_COMMUNITY): Payer: Self-pay | Admitting: Internal Medicine

## 2022-04-14 DIAGNOSIS — D696 Thrombocytopenia, unspecified: Secondary | ICD-10-CM | POA: Diagnosis present

## 2022-04-14 DIAGNOSIS — Z803 Family history of malignant neoplasm of breast: Secondary | ICD-10-CM | POA: Diagnosis not present

## 2022-04-14 DIAGNOSIS — R001 Bradycardia, unspecified: Secondary | ICD-10-CM | POA: Diagnosis present

## 2022-04-14 DIAGNOSIS — J45909 Unspecified asthma, uncomplicated: Secondary | ICD-10-CM | POA: Diagnosis present

## 2022-04-14 DIAGNOSIS — Z880 Allergy status to penicillin: Secondary | ICD-10-CM | POA: Diagnosis not present

## 2022-04-14 DIAGNOSIS — D72819 Decreased white blood cell count, unspecified: Secondary | ICD-10-CM | POA: Diagnosis present

## 2022-04-14 DIAGNOSIS — F32A Depression, unspecified: Secondary | ICD-10-CM | POA: Diagnosis present

## 2022-04-14 DIAGNOSIS — Z79899 Other long term (current) drug therapy: Secondary | ICD-10-CM | POA: Diagnosis not present

## 2022-04-14 DIAGNOSIS — N179 Acute kidney failure, unspecified: Secondary | ICD-10-CM | POA: Diagnosis present

## 2022-04-14 DIAGNOSIS — Z881 Allergy status to other antibiotic agents status: Secondary | ICD-10-CM | POA: Diagnosis not present

## 2022-04-14 DIAGNOSIS — I10 Essential (primary) hypertension: Secondary | ICD-10-CM

## 2022-04-14 DIAGNOSIS — Z923 Personal history of irradiation: Secondary | ICD-10-CM | POA: Diagnosis not present

## 2022-04-14 DIAGNOSIS — I951 Orthostatic hypotension: Secondary | ICD-10-CM | POA: Diagnosis present

## 2022-04-14 DIAGNOSIS — E871 Hypo-osmolality and hyponatremia: Secondary | ICD-10-CM | POA: Diagnosis present

## 2022-04-14 DIAGNOSIS — Z853 Personal history of malignant neoplasm of breast: Secondary | ICD-10-CM | POA: Diagnosis not present

## 2022-04-14 DIAGNOSIS — Z888 Allergy status to other drugs, medicaments and biological substances status: Secondary | ICD-10-CM | POA: Diagnosis not present

## 2022-04-14 DIAGNOSIS — Z885 Allergy status to narcotic agent status: Secondary | ICD-10-CM | POA: Diagnosis not present

## 2022-04-14 LAB — BASIC METABOLIC PANEL
Anion gap: 10 (ref 5–15)
BUN: 22 mg/dL (ref 8–23)
CO2: 25 mmol/L (ref 22–32)
Calcium: 9.5 mg/dL (ref 8.9–10.3)
Chloride: 97 mmol/L — ABNORMAL LOW (ref 98–111)
Creatinine, Ser: 0.97 mg/dL (ref 0.44–1.00)
GFR, Estimated: 59 mL/min — ABNORMAL LOW (ref >60)
Glucose, Bld: 114 mg/dL — ABNORMAL HIGH (ref 70–99)
Potassium: 3.3 mmol/L — ABNORMAL LOW (ref 3.5–5.1)
Sodium: 132 mmol/L — ABNORMAL LOW (ref 135–145)

## 2022-04-14 LAB — SODIUM: Sodium: 128 mmol/L — ABNORMAL LOW (ref 135–145)

## 2022-04-14 LAB — CBC
HCT: 36.7 % (ref 36.0–46.0)
Hemoglobin: 13.1 g/dL (ref 12.0–15.0)
MCH: 32 pg (ref 26.0–34.0)
MCHC: 35.7 g/dL (ref 30.0–36.0)
MCV: 89.5 fL (ref 80.0–100.0)
Platelets: 130 10*3/uL — ABNORMAL LOW (ref 150–400)
RBC: 4.1 MIL/uL (ref 3.87–5.11)
RDW: 11.7 % (ref 11.5–15.5)
WBC: 5.8 10*3/uL (ref 4.0–10.5)
nRBC: 0 % (ref 0.0–0.2)

## 2022-04-14 LAB — SODIUM, URINE, RANDOM: Sodium, Ur: 37 mmol/L

## 2022-04-14 LAB — CORTISOL: Cortisol, Plasma: 11.6 ug/dL

## 2022-04-14 LAB — OSMOLALITY, URINE: Osmolality, Ur: 165 mOsm/kg — ABNORMAL LOW (ref 300–900)

## 2022-04-14 LAB — TSH: TSH: 0.854 u[IU]/mL (ref 0.350–4.500)

## 2022-04-14 MED ORDER — PANTOPRAZOLE SODIUM 20 MG PO TBEC
20.0000 mg | DELAYED_RELEASE_TABLET | Freq: Every morning | ORAL | Status: DC
  Administered 2022-04-14 – 2022-04-15 (×2): 20 mg via ORAL
  Filled 2022-04-14 (×2): qty 1

## 2022-04-14 MED ORDER — ACETAMINOPHEN 650 MG RE SUPP: 650.0000 mg | Freq: Four times a day (QID) | RECTAL | Status: DC | PRN

## 2022-04-14 MED ORDER — DILTIAZEM HCL ER COATED BEADS 240 MG PO CP24: 240.0000 mg | ORAL_CAPSULE | Freq: Every day | ORAL | Status: DC

## 2022-04-14 MED ORDER — SODIUM CHLORIDE 0.9 % IV SOLN: INTRAVENOUS | Status: DC

## 2022-04-14 MED ORDER — VENLAFAXINE HCL ER 37.5 MG PO CP24
37.5000 mg | ORAL_CAPSULE | Freq: Every day | ORAL | Status: DC
  Administered 2022-04-15: 37.5 mg via ORAL
  Filled 2022-04-14: qty 1

## 2022-04-14 MED ORDER — VENLAFAXINE HCL ER 37.5 MG PO CP24
37.5000 mg | ORAL_CAPSULE | Freq: Every day | ORAL | Status: DC
  Administered 2022-04-14: 37.5 mg via ORAL
  Filled 2022-04-14: qty 1

## 2022-04-14 MED ORDER — ENOXAPARIN SODIUM 40 MG/0.4ML IJ SOSY
40.0000 mg | PREFILLED_SYRINGE | INTRAMUSCULAR | Status: DC
  Administered 2022-04-14 – 2022-04-15 (×2): 40 mg via SUBCUTANEOUS
  Filled 2022-04-14 (×2): qty 0.4

## 2022-04-14 MED ORDER — POTASSIUM CHLORIDE CRYS ER 20 MEQ PO TBCR
40.0000 meq | EXTENDED_RELEASE_TABLET | Freq: Once | ORAL | Status: AC
  Administered 2022-04-14: 40 meq via ORAL
  Filled 2022-04-14: qty 2

## 2022-04-14 MED ORDER — DILTIAZEM HCL ER COATED BEADS 120 MG PO CP24
120.0000 mg | ORAL_CAPSULE | Freq: Every day | ORAL | Status: DC
  Administered 2022-04-14 – 2022-04-15 (×2): 120 mg via ORAL
  Filled 2022-04-14 (×2): qty 1

## 2022-04-14 MED ORDER — ACETAMINOPHEN 325 MG PO TABS: 650.0000 mg | ORAL_TABLET | Freq: Four times a day (QID) | ORAL | Status: DC | PRN

## 2022-04-14 MED ORDER — IRBESARTAN 150 MG PO TABS
150.0000 mg | ORAL_TABLET | Freq: Every day | ORAL | Status: DC
  Administered 2022-04-14 – 2022-04-15 (×2): 150 mg via ORAL
  Filled 2022-04-14 (×2): qty 1

## 2022-04-14 MED ORDER — HYDROXYZINE HCL 10 MG PO TABS
5.0000 mg | ORAL_TABLET | Freq: Once | ORAL | Status: AC
Start: 2022-04-14 — End: 2022-04-14
  Administered 2022-04-14: 5 mg via ORAL
  Filled 2022-04-14: qty 1

## 2022-04-14 MED ORDER — HYDRALAZINE HCL 20 MG/ML IJ SOLN
5.0000 mg | INTRAMUSCULAR | Status: DC | PRN
Start: 2022-04-14 — End: 2022-04-15
  Administered 2022-04-14: 5 mg via INTRAVENOUS
  Filled 2022-04-14: qty 1

## 2022-04-14 MED ORDER — ACETAMINOPHEN 325 MG PO TABS
650.0000 mg | ORAL_TABLET | Freq: Two times a day (BID) | ORAL | Status: DC
  Administered 2022-04-14 – 2022-04-15 (×3): 650 mg via ORAL
  Filled 2022-04-14 (×4): qty 2

## 2022-04-14 MED ORDER — POTASSIUM CHLORIDE CRYS ER 20 MEQ PO TBCR
20.0000 meq | EXTENDED_RELEASE_TABLET | Freq: Once | ORAL | Status: AC
  Administered 2022-04-14: 20 meq via ORAL
  Filled 2022-04-14: qty 1

## 2022-04-14 MED ORDER — BUDESONIDE 0.25 MG/2ML IN SUSP
0.2500 mg | Freq: Two times a day (BID) | RESPIRATORY_TRACT | Status: DC
  Administered 2022-04-14 – 2022-04-15 (×3): 0.25 mg via RESPIRATORY_TRACT
  Filled 2022-04-14 (×3): qty 2

## 2022-04-14 NOTE — TOC Initial Note (Signed)
Transition of Care [facility]) - Initial/Assessment Note    Patient Details  Name: Sonya Cantu MRN: [id number] Date of Birth: 10-14-1940  Transition of Care [facility]) CM/SW Contact:    Armanda Heritage, RN Phone Number: 04/14/2022, 2:18 PM  Clinical Narrative:                 CM reviewed chart, noted patient from home, has pcp, and is on room air.  Pt is recommended for HHPT and dme RW, this was discussed with patient who is in agreement. Referral called to [facility] rep Kandee Keen, who accepts patient for HHPT services.  [employer] rep Danielle notified of need for dme RW.  MD notified with request for HH/DME orders.  Expected Discharge Plan: Home w Home Health Services Barriers to Discharge: Continued Medical Work up   Patient Goals and CMS Choice Patient states their goals for this hospitalization and ongoing recovery are:: to get better and go home, reports has been weak for 3 weeks CMS Medicare.gov Compare Post Acute Care list provided to:: Patient Choice offered to / list presented to : Patient  Expected Discharge Plan and Services Expected Discharge Plan: Home w Home Health Services     Post Acute Care Choice: Home Health Living arrangements for the past 2 months: Single Family Home                 DME Arranged: Walker rolling DME Agency: [employer] Date DME Agency Contacted: 04/14/22 Time DME Agency Contacted: [phone] Representative spoke with at DME Agency: Duwayne Heck HH Arranged: PT HH Agency: [facility] Date [facility] Agency Contacted: 04/14/22 Time HH Agency Contacted: 1418 Representative spoke with at [facility] Agency: Kandee Keen  Prior Living Arrangements/Services Living arrangements for the past 2 months: Single Family Home   Patient language and need for interpreter reviewed:: Yes Do you feel safe going back to the place where you live?: Yes      Need for Family Participation in Patient Care: No (Comment)     Criminal Activity/Legal Involvement Pertinent to Current  Situation/Hospitalization: No - Comment as needed  Activities of Daily Living Home Assistive Devices/Equipment: None ADL Screening (condition at time of admission) Patient's cognitive ability adequate to safely complete daily activities?: Yes Is the patient deaf or have difficulty hearing?: No Does the patient have difficulty seeing, even when wearing glasses/contacts?: No Does the patient have difficulty concentrating, remembering, or making decisions?: No Patient able to express need for assistance with ADLs?: No Does the patient have difficulty dressing or bathing?: No Independently performs ADLs?: Yes (appropriate for developmental age) Does the patient have difficulty walking or climbing stairs?: No Weakness of Legs: None Weakness of Arms/Hands: None  Permission Sought/Granted                  Emotional Assessment Appearance:: Appears stated age     Orientation: : Oriented to Self, Oriented to Place, Oriented to  Time, Oriented to Situation   Psych Involvement: No (comment)  Admission diagnosis:  Hyponatremia [E87.1] Acute cystitis without hematuria [N30.00] Patient Active Problem List   Diagnosis Date Noted   Essential hypertension 04/14/2022   Hyponatremia 04/13/2022   PCP:  Clovis Riley, L.August Saucer, MD Pharmacy:   [facility] [phone] - [address], [place] - [address] [place] [address] Phone: [phone] Fax: [phone]     Social Determinants of Health (SDOH) Interventions    Readmission Risk Interventions     No data to display

## 2022-04-14 NOTE — Progress Notes (Signed)
   04/14/22 1512  Pain Assessment  Pain Assessment No/denies pain  Mobility  Activity Ambulated with assistance in hallway  Level of Assistance Standby assist, set-up cues, supervision of patient - no hands on  Assistive Device Front wheel walker  Distance Ambulated (ft) 500 ft  Activity Response Tolerated well  Transport method Ambulatory  $Mobility charge 1 Mobility    Mobility Specialist - Progress Note Pre-mobility:  165/78 BP During mobility:  135/59 BP Post-mobility: 173/49 BP  Pt moved well on her own. Used front wheel walker with no issues. No LOB or dizziness. Catheter was still removed at time of departure. BP was still high when sitting and low when standing.   Marilynne Halsted  Mobility Specialist

## 2022-04-14 NOTE — Evaluation (Signed)
Physical Therapy Evaluation Patient Details Name: Sonya Cantu MRN: [id number] DOB: 1940/10/13 Today's Date: 04/14/2022  History of Present Illness  81 yo female admitted with hyponatremia. Hx of breast ca  Clinical Impression  On eval, pt required Min A for mobility. She had a LOB x 2 during PT session on today. She c/o some dizziness at start of session-BP readings in vitals section of chart. Pt presents with general weakness, decreased activity tolerance, and impaired gait and balance. Discussed d/c plan-pt lives alone and is independent at baseline. She expressed some concerns about discharging home today and managing safely. Secure chat with MD to update him on her performance today and inform him that pt could benefit from remaining in hospital overnight to continue mobilizing with nursing/mobility team/therapy so she can hopefully return home safely with HHPT on tomorrow. OT is on board to work with pt as well.        Recommendations for follow up therapy are one component of a multi-disciplinary discharge planning process, led by the attending physician.  Recommendations may be updated based on patient status, additional functional criteria and insurance authorization.  Follow Up Recommendations Home health PT      Assistance Recommended at Discharge Intermittent Supervision/Assistance  Patient can return home with the following  A little help with walking and/or transfers;Assistance with cooking/housework;Help with stairs or ramp for entrance;Assist for transportation    Equipment Recommendations Rolling walker (2 wheels)  Recommendations for Other Services  OT consult    Functional Status Assessment Patient has had a recent decline in their functional status and demonstrates the ability to make significant improvements in function in a reasonable and predictable amount of time.     Precautions / Restrictions Precautions Precautions: Fall Restrictions Weight Bearing  Restrictions: No      Mobility  Bed Mobility               General bed mobility comments: oob in recliner    Transfers Overall transfer level: Needs assistance Equipment used: None, Rolling walker (2 wheels) Transfers: Sit to/from Stand Sit to Stand: Min assist           General transfer comment: On 1st stand from recliner, pt lost balance and nearly had a fall-required assistance from therapist and mobility specialist to prevent fall. When standing 2nd and 3rd time, with and without RW, pt was MIn guard assist.    Ambulation/Gait Ambulation/Gait assistance: Min assist, Min guard Gait Distance (Feet): 300 Feet (300' with RW, 125 feet without RW) Assistive device: Rolling walker (2 wheels), None         General Gait Details: Min A to steady when walking without device. On way to bathroom, pt had a 2nd LOB. She also c/o dizziness upon standing from toilet. After resting a bit, she was able to continue with walk.  Stairs            Wheelchair Mobility    Modified Rankin (Stroke Patients Only)       Balance Overall balance assessment: Mild deficits observed, not formally tested                                           Pertinent Vitals/Pain Pain Assessment Pain Assessment: No/denies pain    Home Living Family/patient expects to be discharged to:: Private residence Living Arrangements: Alone Available Help at Discharge: Friend(s);Neighbor Type of Home: House  Home Equipment: None      Prior Function Prior Level of Function : Independent/Modified Independent                     Hand Dominance        Extremity/Trunk Assessment   Upper Extremity Assessment Upper Extremity Assessment: Defer to OT evaluation    Lower Extremity Assessment Lower Extremity Assessment: Generalized weakness    Cervical / Trunk Assessment Cervical / Trunk Assessment: Normal  Communication   Communication: No difficulties   Cognition Arousal/Alertness: Awake/alert Behavior During Therapy: WFL for tasks assessed/performed Overall Cognitive Status: Within Functional Limits for tasks assessed                                          General Comments      Exercises     Assessment/Plan    PT Assessment Patient needs continued PT services  PT Problem List Decreased strength;Decreased balance;Decreased activity tolerance;Decreased mobility;Decreased knowledge of use of DME       PT Treatment Interventions DME instruction;Gait training;Functional mobility training;Therapeutic activities;Balance training;Patient/family education;Therapeutic exercise    PT Goals (Current goals can be found in the Care Plan section)  Acute Rehab PT Goals Patient Stated Goal: regain PLOF/independence PT Goal Formulation: With patient Time For Goal Achievement: 04/28/22 Potential to Achieve Goals: Good    Frequency Min 3X/week     Co-evaluation               AM-PAC PT "6 Clicks" Mobility  Outcome Measure Help needed turning from your back to your side while in a flat bed without using bedrails?: A Little Help needed moving from lying on your back to sitting on the side of a flat bed without using bedrails?: A Little Help needed moving to and from a bed to a chair (including a wheelchair)?: A Little Help needed standing up from a chair using your arms (e.g., wheelchair or bedside chair)?: A Little Help needed to walk in hospital room?: A Little Help needed climbing 3-5 steps with a railing? : A Little 6 Click Score: 18    End of Session Equipment Utilized During Treatment: Gait belt Activity Tolerance: Patient tolerated treatment well;Patient limited by fatigue Patient left: in chair;with call bell/phone within reach   PT Visit Diagnosis: Muscle weakness (generalized) (M62.81)    Time: 3664-4034 PT Time Calculation (min) (ACUTE ONLY): 25 min   Charges:   PT Evaluation $PT Eval Low  Complexity: 1 Low PT Treatments $Gait Training: 8-22 mins           Faye Ramsay, PT Acute Rehabilitation  Office: [phone] Pager: [phone]

## 2022-04-14 NOTE — H&P (Addendum)
History and Physical    Sonya Cantu QJF:[id number] DOB: Jul 05, 1941 DOA: 04/13/2022  PCP: Clovis Riley, L.August Saucer, MD  Patient coming from: Home.  Chief Complaint: Weakness.  HPI: Sonya Cantu is a 81 y.o. female with history of hypertension, prior history of breast cancer in remission, depression presents to the ER after patient labs were found to be abnormal.  Last week patient had 3 days of nausea vomiting diarrhea which resolved over the weekend and patient had followed up with her primary care physician since patient was feeling weak.  Labs showed patient had low sodium and increased creatinine was advised to come to the ER.  ED Course: In the ER patient sodium was found to be around 121 creatinine increased from baseline.  UA is abnormal concerning for UTI though patient is asymptomatic.  Patient was started on fluid bolus admitted for further work-up of acute hyponatremia.  Review of Systems: As per HPI, rest all negative.   Past Medical History:  Diagnosis Date   Hypertension    Personal history of radiation therapy     Past Surgical History:  Procedure Laterality Date   BREAST BIOPSY Right 12/03/2003   Malignant Core    BREAST LUMPECTOMY Right 2005     reports that she has never smoked. She has never used smokeless tobacco. She reports that she does not currently use alcohol. She reports that she does not use drugs.  Allergies  Allergen Reactions   Amoxicillin    Clindamycin/Lincomycin    Codeine    Statins     Family History  Problem Relation Age of Onset   Breast cancer Maternal Grandmother     Prior to Admission medications   Medication Sig Start Date End Date Taking? Authorizing Provider  ALPRAZolam Prudy Feeler) 0.25 MG tablet  03/06/20   [provider]  Azelastine HCl 0.15 % SOLN  10/31/19   [provider]  DILT-XR 240 MG 24 hr capsule  01/21/20   [provider]  FLOVENT [facility] 44 MCG/ACT inhaler  03/17/20   [provider]   fluticasone Aleda Grana) 50 MCG/ACT nasal spray  12/24/19   [provider]  gemfibrozil (LOPID) 600 MG tablet  03/17/20   [provider]  hydrochlorothiazide (HYDRODIURIL) 12.5 MG tablet  03/17/20   [provider]  Spacer/Aero-Holding Deretha [facility] (2-Rh) DIAMOND) MISC  12/20/19   [provider]  telmisartan (MICARDIS) 40 MG tablet  03/17/20   [provider]  venlafaxine XR (EFFEXOR-XR) 37.5 MG 24 hr capsule  03/20/20   [provider]  Vitamin D, Ergocalciferol, (DRISDOL) 1.25 MG (50000 UNIT) CAPS capsule  02/14/20   [provider]    Physical Exam: Constitutional: Moderately built and nourished. Vitals:   04/13/22 2245 04/13/22 2325 04/14/22 0000 04/14/22 0011  BP: (!) 146/50  (!) 163/77   Pulse: (!) 54 (!) 50 (!) 56   Resp: 19 18 12    Temp:   98.4 F (36.9 C)   TempSrc:   Oral   SpO2: 97% 94% 95%   Weight:    69.2 kg  Height:    5\' 5"  (1.651 m)   Eyes: Anicteric no pallor. ENMT: No discharge from the ears eyes nose and mouth. Neck: No mass felt.  No neck rigidity. Respiratory: No rhonchi or crepitations. Cardiovascular: S1-S2 heard. Abdomen: Soft nontender bowel sound present. Musculoskeletal: No edema. Skin: No rash. Neurologic: Alert awake oriented time place and person.  Moves all extremities. Psychiatric: Appears normal.  Normal affect.   Labs  on Admission: I have personally reviewed following labs and imaging studies  CBC: Recent Labs  Lab 04/13/22 1837  WBC 3.2*  HGB 13.8  HCT 37.9  MCV 88.6  PLT 140*   Basic Metabolic Panel: Recent Labs  Lab 04/13/22 1837  NA 121*  K 3.6  CL 85*  CO2 23  GLUCOSE 94  BUN 28*  CREATININE 1.27*  CALCIUM 10.0   GFR: Estimated Creatinine Clearance: 33.9 mL/min (A) (by C-G formula based on SCr of 1.27 mg/dL (H)). Liver Function Tests: Recent Labs  Lab 04/13/22 1837  AST 20  ALT 15  ALKPHOS 77  BILITOT 0.8  PROT 6.8  ALBUMIN 4.5   Recent Labs   Lab 04/13/22 1837  LIPASE 128*   No results for input(s): "AMMONIA" in the last 168 hours. Coagulation Profile: No results for input(s): "INR", "PROTIME" in the last 168 hours. Cardiac Enzymes: Recent Labs  Lab 04/13/22 1837  CKTOTAL 108   BNP (last 3 results) No results for input(s): "PROBNP" in the last 8760 hours. HbA1C: No results for input(s): "HGBA1C" in the last 72 hours. CBG: No results for input(s): "GLUCAP" in the last 168 hours. Lipid Profile: No results for input(s): "CHOL", "HDL", "LDLCALC", "TRIG", "CHOLHDL", "LDLDIRECT" in the last 72 hours. Thyroid Function Tests: No results for input(s): "TSH", "T4TOTAL", "FREET4", "T3FREE", "THYROIDAB" in the last 72 hours. Anemia Panel: No results for input(s): "VITAMINB12", "FOLATE", "FERRITIN", "TIBC", "IRON", "RETICCTPCT" in the last 72 hours. Urine analysis:    Component Value Date/Time   COLORURINE YELLOW 04/13/2022 1945   APPEARANCEUR CLEAR 04/13/2022 1945   LABSPEC 1.006 04/13/2022 1945   PHURINE 5.5 04/13/2022 1945   GLUCOSEU NEGATIVE 04/13/2022 1945   HGBUR NEGATIVE 04/13/2022 1945   BILIRUBINUR NEGATIVE 04/13/2022 1945   KETONESUR NEGATIVE 04/13/2022 1945   PROTEINUR NEGATIVE 04/13/2022 1945   NITRITE POSITIVE (A) 04/13/2022 1945   LEUKOCYTESUR MODERATE (A) 04/13/2022 1945   Sepsis Labs: @LABRCNTIP (procalcitonin:4,lacticidven:4) )No results found for this or any previous visit (from the past 240 hour(s)).   Radiological Exams on Admission: No results found.  EKG: Independently reviewed.  Sinus bradycardia heart rate around 40 bpm.  Assessment/Plan Principal Problem:   Hyponatremia Active Problems:   Essential hypertension    Acute hyponatremia -likely could be from recent bout of gastroenteritis.  Patient in addition using hydrochlorothiazide.  Will hold hydrochlorothiazide.  Patient did receive 1 L of normal saline bolus.  We will repeat metabolic panel stat for now we will keep patient on normal  saline at 75 cc/h.  Closely follow sodium trends.  Check urine sodium osmolality in addition we will check TSH and cortisol level. Sinus bradycardia -check TSH levels.  Patient is on Cardizem dose of which may need to be decreased if bradycardia persists. History of hypertension holding hydrochlorothiazide due to hyponatremia.  Continue telmisartan if creatinine improves.  Cardizem dose may need to be decreased if bradycardia persists.  As needed IV hydralazine has been ordered. History of depression on Effexor. History of asthma not wheezing at this time. Prior history of breast cancer in remission. Abnormal UA patient asymptomatic for urine cultures. Thrombocytopenia -appears to be chronic follow CBC. Leukopenia -patient afebrile at this time will follow CBC.  Since patient has acute hyponatremia with symptoms of weakness and dizziness may need close monitoring and further testing inpatient management.   DVT prophylaxis: Lovenox. Code Status: Full code. Family Communication: Discussed with patient. Disposition Plan: Home. Consults called: None. Admission status: Inpatient.   MD  [facility] Pager [phone].  If 7PM-7AM, please contact night-coverage [email or web address] Password [email or web address]  04/14/2022, 2:42 AM

## 2022-04-14 NOTE — Evaluation (Signed)
Occupational Therapy Evaluation Patient Details Name: Sonya Cantu MRN: [id number] DOB: [birth year] Today's Date: 04/14/2022   History of Present Illness 81 yo female admitted with hyponatremia. Hx of breast ca   Clinical Impression   Sonya Cantu is an 81 year old woman admitted with hyponatremia. On evaluation she demonstrates functional upper body strength - though she shoulders a bilaterally weak - and ability to perform ADLs with min guard to supervision. She had no overt loss of balance with OT but reports nearly falling when she got out of char with PT. She demonstrated ability to don socks and shoes and perform toilet transfer on low toilet. She ambulated 2l00 feet in hallway without loss of balance. She reports still feeling weaker than normal but otherwise is functional. From an OT standpoint she is doing well. Will sign off and defer to PT for further treatment for balance and activity tolerance.       Recommendations for follow up therapy are one component of a multi-disciplinary discharge planning process, led by the attending physician.  Recommendations may be updated based on patient status, additional functional criteria and insurance authorization.   Follow Up Recommendations  No OT follow up    Assistance Recommended at Discharge PRN  Patient can return home with the following Assistance with cooking/housework;Help with stairs or ramp for entrance    Functional Status Assessment  Patient has had a recent decline in their functional status and demonstrates the ability to make significant improvements in function in a reasonable and predictable amount of time.  Equipment Recommendations  None recommended by OT    Recommendations for Other Services       Precautions / Restrictions Precautions Precautions: Fall Restrictions Weight Bearing Restrictions: No      Mobility Bed Mobility Overal bed mobility: Modified Independent                   Transfers Overall transfer level: Needs assistance Equipment used: None               General transfer comment: Min guard to ambulate without a deivce. OT had patient wear her shoes to ambulate and she did better without overt loss of balance. She still reports feeling weak.      Balance Overall balance assessment: Mild deficits observed, not formally tested                                         ADL either performed or assessed with clinical judgement   ADL Overall ADL's : Needs assistance/impaired Eating/Feeding: Independent   Grooming: Independent   Upper Body Bathing: Supervision/ safety   Lower Body Bathing: Supervison/ safety   Upper Body Dressing : Supervision/safety   Lower Body Dressing: Supervision/safety   Toilet Transfer: Min guard   Toileting- Clothing Manipulation and Hygiene: Supervision/safety       Functional mobility during ADLs: Min guard General ADL Comments: Min guard to ambulate without a device. Mild unsteadiness but no overt loss of balance.     Vision Patient Visual Report: No change from baseline       Perception     Praxis      Pertinent Vitals/Pain Pain Assessment Pain Assessment: No/denies pain     Hand Dominance Right   Extremity/Trunk Assessment Upper Extremity Assessment Upper Extremity Assessment: Overall WFL for tasks assessed   Lower Extremity Assessment Lower Extremity Assessment: Defer  to PT evaluation   Cervical / Trunk Assessment Cervical / Trunk Assessment: Normal   Communication Communication Communication: No difficulties   Cognition Arousal/Alertness: Awake/alert Behavior During Therapy: WFL for tasks assessed/performed                                         General Comments       Exercises     Shoulder Instructions      Home Living Family/patient expects to be discharged to:: Private residence Living Arrangements: Alone Available Help at Discharge:  Friend(s);Neighbor Type of Home: House Home Access: Level entry     Home Layout: One level     Bathroom Shower/Tub: Chief Strategy Officer: Handicapped height     Home Equipment: None;Grab bars - tub/shower          Prior Functioning/Environment Prior Level of Function : Independent/Modified Independent                        OT Problem List: Decreased activity tolerance;Impaired balance (sitting and/or standing)      OT Treatment/Interventions:      OT Goals(Current goals can be found in the care plan section) Acute Rehab OT Goals OT Goal Formulation: All assessment and education complete, DC therapy  OT Frequency:      Co-evaluation              AM-PAC OT "6 Clicks" Daily Activity     Outcome Measure Help from another person eating meals?: None Help from another person taking care of personal grooming?: None Help from another person toileting, which includes using toliet, bedpan, or urinal?: None Help from another person bathing (including washing, rinsing, drying)?: None Help from another person to put on and taking off regular upper body clothing?: None Help from another person to put on and taking off regular lower body clothing?: None 6 Click Score: 24   End of Session Equipment Utilized During Treatment: Rolling walker (2 wheels) Nurse Communication: Mobility status  Activity Tolerance: Patient tolerated treatment well Patient left: in bed;with call bell/phone within reach  OT Visit Diagnosis: Unsteadiness on feet (R26.81)                Time: 3220-2542 OT Time Calculation (min): 11 min Charges:  OT General Charges $OT Visit: 1 Visit OT Evaluation $OT Eval Low Complexity: 1 Low  Ilia Engelbert, OTR/L Acute Care Rehab Services  Office [phone] Pager: [phone]   Kelli Churn 04/14/2022, 3:16 PM

## 2022-04-14 NOTE — Progress Notes (Signed)
This is a pleasant 81 year old lady with history of hypertension and prior breast cancer who presented to ED with complaint of weakness and was diagnosed with hyponatremia.  Patient seen and examined this morning.  She feels much better.  Her sodium has improved from 121 yesterday to 132 today without any intervention.  However it dropped to 128 again this afternoon.  Work-up is against SIADH.  Source of hyponatremia likely hypovolemia.  We will recheck sodium again later today, if further drop, may start on sodium chloride fluids.  Due to weakness, she was seen by PT OT who recommends home health PT OT however they strongly recommended patient stays overnight so that she can be seen by PT again tomorrow.  Per their recommendations, we are going to keep her today.  On examination, she is fully alert and oriented, lungs clear to auscultation, abdomen soft nontender.

## 2022-04-14 NOTE — ED Notes (Signed)
[facility] arrived to transport pt. Pt stable at time of departure ?

## 2022-04-14 NOTE — Progress Notes (Signed)
When pt is ready for discharge, please contact her son Barbara Cower on file for pick up when discharge order placed.

## 2022-04-15 ENCOUNTER — Other Ambulatory Visit (HOSPITAL_COMMUNITY): Payer: Self-pay

## 2022-04-15 DIAGNOSIS — N179 Acute kidney failure, unspecified: Secondary | ICD-10-CM

## 2022-04-15 DIAGNOSIS — E871 Hypo-osmolality and hyponatremia: Secondary | ICD-10-CM | POA: Diagnosis not present

## 2022-04-15 DIAGNOSIS — I951 Orthostatic hypotension: Secondary | ICD-10-CM | POA: Diagnosis present

## 2022-04-15 LAB — BASIC METABOLIC PANEL
Anion gap: 9 (ref 5–15)
BUN: 17 mg/dL (ref 8–23)
CO2: 22 mmol/L (ref 22–32)
Calcium: 9 mg/dL (ref 8.9–10.3)
Chloride: 101 mmol/L (ref 98–111)
Creatinine, Ser: 0.88 mg/dL (ref 0.44–1.00)
GFR, Estimated: >60 mL/min (ref >60)
Glucose, Bld: 89 mg/dL (ref 70–99)
Potassium: 4.2 mmol/L (ref 3.5–5.1)
Sodium: 132 mmol/L — ABNORMAL LOW (ref 135–145)

## 2022-04-15 LAB — URINE CULTURE: Culture: >=100000 — AB

## 2022-04-15 MED ORDER — DILTIAZEM HCL ER COATED BEADS 120 MG PO CP24
120.0000 mg | ORAL_CAPSULE | Freq: Every day | ORAL | 0 refills | Status: DC
Start: 2022-04-16 — End: 2024-01-06
  Filled 2022-04-15 (×2): qty 30, 30d supply, fill #0

## 2022-04-15 MED ORDER — MIDODRINE HCL 2.5 MG PO TABS
2.5000 mg | ORAL_TABLET | Freq: Three times a day (TID) | ORAL | 0 refills | Status: AC
  Filled 2022-04-15: qty 90, 30d supply, fill #0

## 2022-04-15 NOTE — Discharge Summary (Signed)
PatientPhysician Discharge Summary  Sonya Cantu SWF:[id number] DOB: July 02, 1941 DOA: 04/13/2022  PCP: Clovis Riley, L.August Saucer, MD  Admit date: 04/13/2022 Discharge date: 04/15/2022 30 Day Unplanned Readmission Risk Score    Flowsheet Row ED to Hosp-Admission (Current) from 04/13/2022 in [address] 4TH FLOOR PROGRESSIVE CARE AND UROLOGY  30 Day Unplanned Readmission Risk Score (%) 10.97 Filed at 04/15/2022 0801       This score is the patient's risk of an unplanned readmission within 30 days of being discharged (0 -100%). The score is based on dignosis, age, lab data, medications, orders, and past utilization.   Low:  0-14.9   Medium: 15-21.9   High: 22-29.9   Extreme: 30 and above          Admitted From: Home Disposition: Home  Recommendations for Outpatient Follow-up:  Follow up with PCP in 1-2 weeks Please obtain BMP/CBC in one week Please follow up with your PCP on the following pending results: Unresulted Labs (From admission, onward)     Start     Ordered   04/21/22 0500  Creatinine, serum  (enoxaparin (LOVENOX)    CrCl >/= 30 ml/min)  Weekly,   R     Comments: while on enoxaparin therapy   Question:  Specimen collection method  Answer:  Lab=Lab collect   04/14/22 0241   04/15/22 0500  Basic metabolic panel  Daily at 5am,   R     Question:  Specimen collection method  Answer:  Lab=Lab collect   04/14/22 0803              Home Health: Yes Equipment/Devices: Rolling walker  Discharge Condition: Stable CODE STATUS: Full code Diet recommendation: Cardiac  Subjective: Seen and examined.  She has no complaints.  She is eager to go home.  Brief/Interim Summary:  Sonya Cantu is a 81 y.o. female with history of hypertension, prior history of breast cancer in remission, depression presented to the ER after patient labs were found to be abnormal.  Last week patient had 3 days of nausea vomiting diarrhea which resolved over the weekend and patient had followed up with her  primary care physician since patient was feeling weak.  Labs showed patient had low sodium and increased creatinine was advised to come to the ER.   Upon arrival to ER, she was hemodynamically stable and her sodium was 121 as well as creatinine increased from baseline.  UA is abnormal but patient did not have urinary complaints.  Patient was started on fluid bolus admitted for further work-up of acute hyponatremia as well as AKI.  she received some IV fluids in the ED.  Her sodium improved to 132.  She was initially seen by PT OT yesterday and was found to have some problem with balancing so per their recommendations, patient was kept in the hospital last night.  She was reassessed by PT today.  She is better.  They have recommended home health with rolling walker which is ordered for her.  Due to hyponatremia, I am discontinuing her hydrochlorothiazide.  Also when patient came in, she was bradycardic but asymptomatic.  She was on Cardizem CD 240 mg daily which was reduced to 120 mg daily and her bradycardia has improved, she is being discharged on reduced dose of Cardizem CD.  She was also found to have orthostatic positive today but she was asymptomatic for that as well.  Due to this, I am discharging her on midodrine 2.5 mg 3 times daily.  Discharge plan was  discussed with patient and/or family member and they verbalized understanding and agreed with it.  Discharge Diagnoses:  Principal Problem:   Hyponatremia Active Problems:   Essential hypertension   Orthostatic hypotension   AKI (acute kidney injury) (HCC)    Discharge Instructions   Allergies as of 04/15/2022       Reactions   Statins Other (See Comments)   Muscle pain   Amoxicillin Other (See Comments)   Unknown reaction   Clindamycin/lincomycin Other (See Comments)   Unknown reaction   Codeine Other (See Comments)   Unknown reaction   Gemfibrozil Other (See Comments)   Muscle pain   Other Other (See Comments)   Unknown  reaction to Meprozine        Medication List     STOP taking these medications    Dilt-XR 240 MG 24 hr capsule Generic drug: diltiazem   hydrochlorothiazide 12.5 MG tablet Commonly known as: HYDRODIURIL       TAKE these medications    acetaminophen 650 MG CR tablet Commonly known as: TYLENOL Take 1,300 mg by mouth 2 (two) times daily.   ALPRAZolam 0.25 MG tablet Commonly known as: XANAX Take 0.25 mg by mouth at bedtime as needed for anxiety.   diltiazem 120 MG 24 hr capsule Commonly known as: CARDIZEM CD Take 1 capsule (120 mg total) by mouth daily. Start taking on: April 16, 2022   FISH OIL PO Take 1 capsule by mouth daily with lunch.   fluticasone 44 MCG/ACT inhaler Commonly known as: FLOVENT HFA Inhale 2 puffs into the lungs 2 (two) times daily.   fluticasone 50 MCG/ACT nasal spray Commonly known as: FLONASE Place 1 spray into both nostrils daily as needed for allergies or rhinitis.   midodrine 2.5 MG tablet Commonly known as: PROAMATINE Take 1 tablet (2.5 mg total) by mouth 3 (three) times daily with meals.   multivitamin with minerals Tabs tablet Take 1 tablet by mouth daily with lunch.   Ocuvite Adult 50+ Caps Take 1 capsule by mouth daily with lunch.   ondansetron 4 MG disintegrating tablet Commonly known as: ZOFRAN-ODT Take 4 mg by mouth 3 (three) times daily as needed for nausea or vomiting.   optichamber diamond Misc   pantoprazole 20 MG tablet Commonly known as: PROTONIX Take 20 mg by mouth every morning.   PROBIOTIC PO Take 1 tablet by mouth daily after breakfast.   telmisartan 40 MG tablet Commonly known as: MICARDIS Take 40 mg by mouth every morning.   venlafaxine XR 37.5 MG 24 hr capsule Commonly known as: EFFEXOR-XR Take 37.5 mg by mouth daily with lunch.   Vitamin D (Ergocalciferol) 1.25 MG (50000 UNIT) Caps capsule Commonly known as: DRISDOL Take 50,000 Units by mouth every Thursday.               Durable Medical  Equipment  (From admission, onward)           Start     Ordered   04/14/22 1358  For home use only DME Walker rolling  Once       Question Answer Comment  Walker: With 5 Inch Wheels   Patient needs a walker to treat with the following condition Balance problem      04/14/22 1358            Follow-up Information     [facility] Follow up.   Specialty: Home Health Services Why: agency will privide home health therapy Contact information: [address] [place] [address] [phone]         Clovis RileyMitchell, L.August Saucerean, MD Follow up in 1 week(s).   Specialty: Family Medicine Contact information: [address] [place] [address] [phone]                Allergies  Allergen Reactions   Statins Other (See Comments)    Muscle pain   Amoxicillin Other (See Comments)    Unknown reaction   Clindamycin/Lincomycin Other (See Comments)    Unknown reaction   Codeine Other (See Comments)    Unknown reaction   Gemfibrozil Other (See Comments)    Muscle pain   Other Other (See Comments)    Unknown reaction to Meprozine    Consultations: None   Procedures/Studies: MM 3D SCREEN BREAST BILATERAL  Result Date: 03/31/2022 CLINICAL DATA:  Screening. EXAM: DIGITAL SCREENING BILATERAL MAMMOGRAM WITH TOMOSYNTHESIS AND CAD TECHNIQUE: Bilateral screening digital craniocaudal and mediolateral oblique mammograms were obtained. Bilateral screening digital breast tomosynthesis was performed. The images were evaluated with computer-aided detection. COMPARISON:  Previous exam(s). ACR Breast Density Category c: The breast tissue is heterogeneously dense, which may obscure small masses. FINDINGS: There are no findings suspicious for malignancy. IMPRESSION: No mammographic evidence of malignancy. A result letter of this screening mammogram will be mailed directly to the patient. RECOMMENDATION: Screening mammogram in one year. (Code:[id number]-B-01Y)  BI-RADS CATEGORY  1: Negative. Electronically Signed   By: Sande BrothersSerena  Chacko M.D.   On: 03/31/2022 11:06     Discharge Exam: Vitals:   04/15/22 0539 04/15/22 0720  BP: (!) 154/66   Pulse: 73   Resp: 17 (!) 21  Temp: 98 F (36.7 C)   SpO2: 97% 95%   Vitals:   04/14/22 1252 04/14/22 1953 04/15/22 0539 04/15/22 0720  BP: (!) 143/58  (!) 154/66   Pulse: (!) 57  73   Resp: 17  17 (!) 21  Temp: 97.6 F (36.4 C)  98 F (36.7 C)   TempSrc: Oral  Oral   SpO2: 96% 97% 97% 95%  Weight:      Height:        General: Pt is alert, awake, not in acute distress Cardiovascular: RRR, S1/S2 +, no rubs, no gallops Respiratory: CTA bilaterally, no wheezing, no rhonchi Abdominal: Soft, NT, ND, bowel sounds + Extremities: no edema, no cyanosis    The results of significant diagnostics from this hospitalization (including imaging, microbiology, ancillary and laboratory) are listed below for reference.     Microbiology: Recent Results (from the past 240 hour(s))  Urine Culture     Status: Abnormal   Collection Time: 04/13/22  6:37 PM   Specimen: Urine, Clean Catch  Result Value Ref Range Status   Specimen Description   Final    URINE, CLEAN CATCH Performed at [facility], [address], [place], [place] [address]    Special Requests   Final    NONE Performed at [facility], [address], [place], [place] [address]    Culture >=100,000 COLONIES/mL ESCHERICHIA COLI (A)  Final   Report Status 04/15/2022 FINAL  Final   Organism ID, Bacteria ESCHERICHIA COLI (A)  Final      Susceptibility   Escherichia coli - MIC*    AMPICILLIN 4 SENSITIVE Sensitive     CEFAZOLIN <=4 SENSITIVE Sensitive     CEFEPIME <=0.12 SENSITIVE Sensitive     CEFTRIAXONE <=0.25 SENSITIVE Sensitive     CIPROFLOXACIN <=0.25 SENSITIVE Sensitive     GENTAMICIN <=  1 SENSITIVE Sensitive     IMIPENEM <=0.25 SENSITIVE Sensitive     NITROFURANTOIN <=16 SENSITIVE Sensitive      TRIMETH/SULFA <=20 SENSITIVE Sensitive     AMPICILLIN/SULBACTAM <=2 SENSITIVE Sensitive     PIP/TAZO <=4 SENSITIVE Sensitive     * >=100,000 COLONIES/mL ESCHERICHIA COLI     Labs: BNP (last 3 results) No results for input(s): "BNP" in the last 8760 hours. Basic Metabolic Panel: Recent Labs  Lab 04/13/22 1837 04/14/22 0433 04/14/22 1154 04/15/22 0437  NA 121* 132* 128* 132*  K 3.6 3.3*  --  4.2  CL 85* 97*  --  101  CO2 23 25  --  22  GLUCOSE 94 114*  --  89  BUN 28* 22  --  17  CREATININE 1.27* 0.97  --  0.88  CALCIUM 10.0 9.5  --  9.0   Liver Function Tests: Recent Labs  Lab 04/13/22 1837  AST 20  ALT 15  ALKPHOS 77  BILITOT 0.8  PROT 6.8  ALBUMIN 4.5   Recent Labs  Lab 04/13/22 1837  LIPASE 128*   No results for input(s): "AMMONIA" in the last 168 hours. CBC: Recent Labs  Lab 04/13/22 1837 04/14/22 0433  WBC 3.2* 5.8  HGB 13.8 13.1  HCT 37.9 36.7  MCV 88.6 89.5  PLT 140* 130*   Cardiac Enzymes: Recent Labs  Lab 04/13/22 1837  CKTOTAL 108   BNP: Invalid input(s): "POCBNP" CBG: No results for input(s): "GLUCAP" in the last 168 hours. D-Dimer No results for input(s): "DDIMER" in the last 72 hours. Hgb A1c No results for input(s): "HGBA1C" in the last 72 hours. Lipid Profile No results for input(s): "CHOL", "HDL", "LDLCALC", "TRIG", "CHOLHDL", "LDLDIRECT" in the last 72 hours. Thyroid function studies Recent Labs    04/14/22 0433  TSH 0.854   Anemia work up No results for input(s): "VITAMINB12", "FOLATE", "FERRITIN", "TIBC", "IRON", "RETICCTPCT" in the last 72 hours. Urinalysis    Component Value Date/Time   COLORURINE YELLOW 04/13/2022 1945   APPEARANCEUR CLEAR 04/13/2022 1945   LABSPEC 1.006 04/13/2022 1945   PHURINE 5.5 04/13/2022 1945   GLUCOSEU NEGATIVE 04/13/2022 1945   HGBUR NEGATIVE 04/13/2022 1945   BILIRUBINUR NEGATIVE 04/13/2022 1945   KETONESUR NEGATIVE 04/13/2022 1945   PROTEINUR NEGATIVE 04/13/2022 1945   NITRITE  POSITIVE (A) 04/13/2022 1945   LEUKOCYTESUR MODERATE (A) 04/13/2022 1945   Sepsis Labs Recent Labs  Lab 04/13/22 1837 04/14/22 0433  WBC 3.2* 5.8   Microbiology Recent Results (from the past 240 hour(s))  Urine Culture     Status: Abnormal   Collection Time: 04/13/22  6:37 PM   Specimen: Urine, Clean Catch  Result Value Ref Range Status   Specimen Description   Final    URINE, CLEAN CATCH Performed at [facility], [address] [place], [place] [address]    Special Requests   Final    NONE Performed at [facility], [address] [place], [place] [address]    Culture >=100,000 COLONIES/mL ESCHERICHIA COLI (A)  Final   Report Status 04/15/2022 FINAL  Final   Organism ID, Bacteria ESCHERICHIA COLI (A)  Final      Susceptibility   Escherichia coli - MIC*    AMPICILLIN 4 SENSITIVE Sensitive     CEFAZOLIN <=4 SENSITIVE Sensitive     CEFEPIME <=0.12 SENSITIVE Sensitive     CEFTRIAXONE <=0.25 SENSITIVE Sensitive     CIPROFLOXACIN <=0.25 SENSITIVE Sensitive     GENTAMICIN <=1 SENSITIVE  Sensitive     IMIPENEM <=0.25 SENSITIVE Sensitive     NITROFURANTOIN <=16 SENSITIVE Sensitive     TRIMETH/SULFA <=20 SENSITIVE Sensitive     AMPICILLIN/SULBACTAM <=2 SENSITIVE Sensitive     PIP/TAZO <=4 SENSITIVE Sensitive     * >=100,000 COLONIES/mL ESCHERICHIA COLI     Time coordinating discharge: Over 30 minutes  SIGNED:   Hughie Closs, MD  [facility] 04/15/2022, 10:05 AM *Please note that this is a verbal dictation therefore any spelling or grammatical errors are due to the "Dragon [employer] One" system interpretation. If 7PM-7AM, please contact night-coverage [email or web address]

## 2022-04-15 NOTE — Progress Notes (Signed)
Physical Therapy Treatment Patient Details Name: Sonya Cantu MRN: [id number] DOB: [birth year] Today's Date: 04/15/2022   History of Present Illness 81 yo female admitted with hyponatremia. Hx of breast ca    PT Comments    Pt is mobilizing well without loss of balance and with no dizziness. She ambulated 250' with RW, no loss of balance. Noted pt had significant systolic drop with sit to stand yesterday, so reassessed today. She had a 39 point drop from sit to stand (50 point drop from supine to stand -see flowsheets) but denied dizziness, verbally informed Dr. Doristine Bosworth of orthostatic findings. Pt is ready to DC home from a PT standpoint.     Recommendations for follow up therapy are one component of a multi-disciplinary discharge planning process, led by the attending physician.  Recommendations may be updated based on patient status, additional functional criteria and insurance authorization.  Follow Up Recommendations  Home health PT     Assistance Recommended at Discharge Set up Supervision/Assistance  Patient can return home with the following Assistance with cooking/housework;Help with stairs or ramp for entrance;Assist for transportation   Equipment Recommendations  Rolling walker (2 wheels)    Recommendations for Other Services       Precautions / Restrictions Precautions Precautions: Fall Restrictions Weight Bearing Restrictions: No     Mobility  Bed Mobility               General bed mobility comments: oob in recliner    Transfers Overall transfer level: Modified independent Equipment used: Rolling walker (2 wheels) Transfers: Sit to/from Stand Sit to Stand: Modified independent (Device/Increase time)           General transfer comment: steady, no loss of balance, no assist needed, no dizziness with sit to stand (however pt had 39 point drop systolic BP with sit to stand -see flowsheets)    Ambulation/Gait Ambulation/Gait assistance: Modified  independent (Device/Increase time) Gait Distance (Feet): 250 Feet Assistive device: Rolling walker (2 wheels) Gait Pattern/deviations: WFL(Within Functional Limits) Gait velocity: WFL     General Gait Details: steady, no loss of balance, no dizziness   Stairs             Wheelchair Mobility    Modified Rankin (Stroke Patients Only)       Balance Overall balance assessment: Mild deficits observed, not formally tested                                          Cognition Arousal/Alertness: Awake/alert Behavior During Therapy: WFL for tasks assessed/performed Overall Cognitive Status: Within Functional Limits for tasks assessed                                          Exercises      General Comments        Pertinent Vitals/Pain Pain Assessment Pain Assessment: No/denies pain    Home Living                          Prior Function            PT Goals (current goals can now be found in the care plan section) Acute Rehab PT Goals Patient Stated Goal: regain PLOF/independence PT Goal Formulation: With patient Time For  Goal Achievement: 04/28/22 Potential to Achieve Goals: Good Progress towards PT goals: Goals met/education completed, patient discharged from PT    Frequency    Min 3X/week      PT Plan Current plan remains appropriate    Co-evaluation              AM-PAC PT "6 Clicks" Mobility   Outcome Measure  Help needed turning from your back to your side while in a flat bed without using bedrails?: None Help needed moving from lying on your back to sitting on the side of a flat bed without using bedrails?: A Little Help needed moving to and from a bed to a chair (including a wheelchair)?: None Help needed standing up from a chair using your arms (e.g., wheelchair or bedside chair)?: None Help needed to walk in hospital room?: None Help needed climbing 3-5 steps with a railing? : A Little 6  Click Score: 22    End of Session Equipment Utilized During Treatment: Gait belt Activity Tolerance: Patient tolerated treatment well Patient left: in chair;with call bell/phone within reach Nurse Communication: Mobility status PT Visit Diagnosis: Muscle weakness (generalized) (M62.81)     Time: 4967-5916 PT Time Calculation (min) (ACUTE ONLY): 19 min  Charges:  $Gait Training: 8-22 mins                     Blondell Reveal Kistler PT 04/15/2022  Acute Rehabilitation Services  Office [phone]

## 2022-04-15 NOTE — Plan of Care (Signed)
?  Problem: Education: ?Goal: Knowledge of General Education information will improve ?Description: Including pain rating scale, medication(s)/side effects and non-pharmacologic comfort measures ?Outcome: Adequate for Discharge ?  ?Problem: Health Behavior/Discharge Planning: ?Goal: Ability to manage health-related needs will improve ?Outcome: Adequate for Discharge ?  ?Problem: Clinical Measurements: ?Goal: Ability to maintain clinical measurements within normal limits will improve ?Outcome: Adequate for Discharge ?Goal: Will remain free from infection ?Outcome: Adequate for Discharge ?Goal: Diagnostic test results will improve ?Outcome: Adequate for Discharge ?Goal: Respiratory complications will improve ?Outcome: Adequate for Discharge ?Goal: Cardiovascular complication will be avoided ?Outcome: Adequate for Discharge ?  ?Problem: Activity: ?Goal: Risk for activity intolerance will decrease ?Outcome: Adequate for Discharge ?  ?Problem: Nutrition: ?Goal: Adequate nutrition will be maintained ?Outcome: Adequate for Discharge ?  ?Problem: Coping: ?Goal: Level of anxiety will decrease ?Outcome: Adequate for Discharge ?  ?Problem: Elimination: ?Goal: Will not experience complications related to bowel motility ?Outcome: Adequate for Discharge ?Goal: Will not experience complications related to urinary retention ?Outcome: Adequate for Discharge ?  ?Problem: Pain Managment: ?Goal: General experience of comfort will improve ?Outcome: Adequate for Discharge ?  ?Problem: Safety: ?Goal: Ability to remain free from injury will improve ?Outcome: Adequate for Discharge ?  ?Problem: Skin Integrity: ?Goal: Risk for impaired skin integrity will decrease ?Outcome: Adequate for Discharge ? ? Pt dc home per MD order. ?  ?

## 2022-04-17 DIAGNOSIS — D72819 Decreased white blood cell count, unspecified: Secondary | ICD-10-CM | POA: Diagnosis not present

## 2022-04-17 DIAGNOSIS — N179 Acute kidney failure, unspecified: Secondary | ICD-10-CM | POA: Diagnosis not present

## 2022-04-17 DIAGNOSIS — I951 Orthostatic hypotension: Secondary | ICD-10-CM | POA: Diagnosis not present

## 2022-04-17 DIAGNOSIS — E871 Hypo-osmolality and hyponatremia: Secondary | ICD-10-CM | POA: Diagnosis not present

## 2022-04-17 DIAGNOSIS — D696 Thrombocytopenia, unspecified: Secondary | ICD-10-CM | POA: Diagnosis not present

## 2022-04-17 DIAGNOSIS — R001 Bradycardia, unspecified: Secondary | ICD-10-CM | POA: Diagnosis not present

## 2022-04-17 DIAGNOSIS — J45909 Unspecified asthma, uncomplicated: Secondary | ICD-10-CM | POA: Diagnosis not present

## 2022-04-17 DIAGNOSIS — F32A Depression, unspecified: Secondary | ICD-10-CM | POA: Diagnosis not present

## 2022-04-17 DIAGNOSIS — I10 Essential (primary) hypertension: Secondary | ICD-10-CM | POA: Diagnosis not present

## 2022-04-20 DIAGNOSIS — I10 Essential (primary) hypertension: Secondary | ICD-10-CM | POA: Diagnosis not present

## 2022-04-20 DIAGNOSIS — E860 Dehydration: Secondary | ICD-10-CM | POA: Diagnosis not present

## 2022-04-20 DIAGNOSIS — R202 Paresthesia of skin: Secondary | ICD-10-CM | POA: Diagnosis not present

## 2022-04-22 ENCOUNTER — Other Ambulatory Visit (HOSPITAL_COMMUNITY): Payer: Self-pay

## 2022-05-06 DIAGNOSIS — I10 Essential (primary) hypertension: Secondary | ICD-10-CM | POA: Diagnosis not present

## 2022-05-06 DIAGNOSIS — M79651 Pain in right thigh: Secondary | ICD-10-CM | POA: Diagnosis not present

## 2022-06-02 DIAGNOSIS — Z23 Encounter for immunization: Secondary | ICD-10-CM | POA: Diagnosis not present

## 2022-06-02 DIAGNOSIS — F320 Major depressive disorder, single episode, mild: Secondary | ICD-10-CM | POA: Diagnosis not present

## 2022-06-02 DIAGNOSIS — I10 Essential (primary) hypertension: Secondary | ICD-10-CM | POA: Diagnosis not present

## 2022-06-02 DIAGNOSIS — M150 Primary generalized (osteo)arthritis: Secondary | ICD-10-CM | POA: Diagnosis not present

## 2022-06-21 DIAGNOSIS — M542 Cervicalgia: Secondary | ICD-10-CM | POA: Diagnosis not present

## 2022-06-21 DIAGNOSIS — M25551 Pain in right hip: Secondary | ICD-10-CM | POA: Diagnosis not present

## 2022-06-21 DIAGNOSIS — M25511 Pain in right shoulder: Secondary | ICD-10-CM | POA: Diagnosis not present

## 2022-06-30 DIAGNOSIS — M542 Cervicalgia: Secondary | ICD-10-CM | POA: Diagnosis not present

## 2022-07-01 DIAGNOSIS — L570 Actinic keratosis: Secondary | ICD-10-CM | POA: Diagnosis not present

## 2022-07-01 DIAGNOSIS — L821 Other seborrheic keratosis: Secondary | ICD-10-CM | POA: Diagnosis not present

## 2022-07-21 DIAGNOSIS — M542 Cervicalgia: Secondary | ICD-10-CM | POA: Diagnosis not present

## 2022-07-27 DIAGNOSIS — Z9189 Other specified personal risk factors, not elsewhere classified: Secondary | ICD-10-CM | POA: Diagnosis not present

## 2022-07-27 DIAGNOSIS — Z853 Personal history of malignant neoplasm of breast: Secondary | ICD-10-CM | POA: Diagnosis not present

## 2022-08-02 DIAGNOSIS — M25561 Pain in right knee: Secondary | ICD-10-CM | POA: Diagnosis not present

## 2022-08-03 ENCOUNTER — Emergency Department (HOSPITAL_BASED_OUTPATIENT_CLINIC_OR_DEPARTMENT_OTHER): Payer: Medicare PPO | Admitting: Radiology

## 2022-08-03 ENCOUNTER — Emergency Department (HOSPITAL_BASED_OUTPATIENT_CLINIC_OR_DEPARTMENT_OTHER): Payer: Medicare PPO

## 2022-08-03 ENCOUNTER — Emergency Department (HOSPITAL_BASED_OUTPATIENT_CLINIC_OR_DEPARTMENT_OTHER)
Admission: EM | Admit: 2022-08-03 | Discharge: 2022-08-03 | Disposition: A | Discharge status: Home / Self Care | Payer: Medicare PPO | Attending: Emergency Medicine | Admitting: Emergency Medicine

## 2022-08-03 ENCOUNTER — Encounter (HOSPITAL_BASED_OUTPATIENT_CLINIC_OR_DEPARTMENT_OTHER): Payer: Self-pay | Admitting: Emergency Medicine

## 2022-08-03 ENCOUNTER — Other Ambulatory Visit: Payer: Self-pay

## 2022-08-03 DIAGNOSIS — S098XXA Other specified injuries of head, initial encounter: Secondary | ICD-10-CM | POA: Diagnosis not present

## 2022-08-03 DIAGNOSIS — S62312A Displaced fracture of base of third metacarpal bone, right hand, initial encounter for closed fracture: Secondary | ICD-10-CM | POA: Diagnosis not present

## 2022-08-03 DIAGNOSIS — S62316A Displaced fracture of base of fifth metacarpal bone, right hand, initial encounter for closed fracture: Secondary | ICD-10-CM | POA: Diagnosis not present

## 2022-08-03 DIAGNOSIS — S01111A Laceration without foreign body of right eyelid and periocular area, initial encounter: Secondary | ICD-10-CM | POA: Diagnosis not present

## 2022-08-03 DIAGNOSIS — S0181XA Laceration without foreign body of other part of head, initial encounter: Secondary | ICD-10-CM | POA: Insufficient documentation

## 2022-08-03 DIAGNOSIS — Z23 Encounter for immunization: Secondary | ICD-10-CM | POA: Insufficient documentation

## 2022-08-03 DIAGNOSIS — W19XXXA Unspecified fall, initial encounter: Secondary | ICD-10-CM | POA: Diagnosis not present

## 2022-08-03 DIAGNOSIS — S61212A Laceration without foreign body of right middle finger without damage to nail, initial encounter: Secondary | ICD-10-CM | POA: Insufficient documentation

## 2022-08-03 DIAGNOSIS — S0231XA Fracture of orbital floor, right side, initial encounter for closed fracture: Secondary | ICD-10-CM | POA: Insufficient documentation

## 2022-08-03 DIAGNOSIS — R69 Illness, unspecified: Secondary | ICD-10-CM | POA: Diagnosis not present

## 2022-08-03 DIAGNOSIS — S0011XA Contusion of right eyelid and periocular area, initial encounter: Secondary | ICD-10-CM | POA: Diagnosis not present

## 2022-08-03 DIAGNOSIS — M7989 Other specified soft tissue disorders: Secondary | ICD-10-CM | POA: Insufficient documentation

## 2022-08-03 DIAGNOSIS — Z79899 Other long term (current) drug therapy: Secondary | ICD-10-CM | POA: Insufficient documentation

## 2022-08-03 DIAGNOSIS — M79641 Pain in right hand: Secondary | ICD-10-CM | POA: Diagnosis not present

## 2022-08-03 DIAGNOSIS — I10 Essential (primary) hypertension: Secondary | ICD-10-CM | POA: Insufficient documentation

## 2022-08-03 DIAGNOSIS — S0083XA Contusion of other part of head, initial encounter: Secondary | ICD-10-CM

## 2022-08-03 DIAGNOSIS — S62314A Displaced fracture of base of fourth metacarpal bone, right hand, initial encounter for closed fracture: Secondary | ICD-10-CM | POA: Insufficient documentation

## 2022-08-03 DIAGNOSIS — S0990XA Unspecified injury of head, initial encounter: Secondary | ICD-10-CM | POA: Diagnosis present

## 2022-08-03 DIAGNOSIS — S0993XA Unspecified injury of face, initial encounter: Secondary | ICD-10-CM | POA: Diagnosis not present

## 2022-08-03 DIAGNOSIS — Y92810 Car as the place of occurrence of the external cause: Secondary | ICD-10-CM | POA: Diagnosis not present

## 2022-08-03 DIAGNOSIS — W01198A Fall on same level from slipping, tripping and stumbling with subsequent striking against other object, initial encounter: Secondary | ICD-10-CM | POA: Insufficient documentation

## 2022-08-03 DIAGNOSIS — S199XXA Unspecified injury of neck, initial encounter: Secondary | ICD-10-CM | POA: Diagnosis not present

## 2022-08-03 MED ORDER — CEFAZOLIN SODIUM-DEXTROSE 1-4 GM/50ML-% IV SOLN
1.0000 g | Freq: Once | INTRAVENOUS | Status: AC
  Administered 2022-08-03: 1 g via INTRAVENOUS
  Filled 2022-08-03: qty 50

## 2022-08-03 MED ORDER — FENTANYL CITRATE PF 50 MCG/ML IJ SOSY
50.0000 ug | PREFILLED_SYRINGE | Freq: Once | INTRAMUSCULAR | Status: AC
  Administered 2022-08-03: 50 ug via INTRAVENOUS
  Filled 2022-08-03: qty 1

## 2022-08-03 MED ORDER — LIDOCAINE-EPINEPHRINE (PF) 2 %-1:200000 IJ SOLN
10.0000 mL | Freq: Once | INTRAMUSCULAR | Status: AC
  Administered 2022-08-03: 10 mL
  Filled 2022-08-03: qty 20

## 2022-08-03 MED ORDER — HYDROCODONE-ACETAMINOPHEN 5-325 MG PO TABS: 1.0000 | ORAL_TABLET | Freq: Four times a day (QID) | ORAL | 0 refills | Status: AC | PRN

## 2022-08-03 MED ORDER — HYDROCODONE-ACETAMINOPHEN 5-325 MG PO TABS: 2.0000 | ORAL_TABLET | ORAL | 0 refills | Status: DC | PRN

## 2022-08-03 MED ORDER — TETANUS-DIPHTH-ACELL PERTUSSIS 5-2.5-18.5 LF-MCG/0.5 IM SUSY
0.5000 mL | PREFILLED_SYRINGE | Freq: Once | INTRAMUSCULAR | Status: AC
  Administered 2022-08-03: 0.5 mL via INTRAMUSCULAR
  Filled 2022-08-03: qty 0.5

## 2022-08-03 MED ORDER — CEPHALEXIN 500 MG PO CAPS: 500.0000 mg | ORAL_CAPSULE | Freq: Two times a day (BID) | ORAL | 0 refills | Status: AC

## 2022-08-03 NOTE — Discharge Instructions (Addendum)
Please follow-up outpatient with ophthalmology and ENT regarding your orbital floor fracture. Follow-up outpatient with hand surgery as well. I have prescribed a short course of Keflex and Norco for pain control. Ice the forehead hematoma for the next few days

## 2022-08-03 NOTE — ED Provider Notes (Signed)
[facility] EMERGENCY DEPT Provider Note   CSN: [id number] Arrival date & time: 08/03/22  1000     History {Add pertinent medical, surgical, social history, OB history to HPI:1} Chief Complaint  Patient presents with   Sonya Cantu is a 81 y.o. female.   Fall       Home Medications Prior to Admission medications   Medication Sig Start Date End Date Taking? Authorizing Provider  acetaminophen (TYLENOL) 650 MG CR tablet Take 1,300 mg by mouth 2 (two) times daily.    [provider]  ALPRAZolam Duanne Moron) 0.25 MG tablet Take 0.25 mg by mouth at bedtime as needed for anxiety. 03/06/20   [provider]  DILT-XR 240 MG 24 hr capsule Take 240 mg by mouth daily. 06/02/22   [provider]  diltiazem (CARDIZEM CD) 120 MG 24 hr capsule Take 1 capsule by mouth daily. 04/16/22 05/16/22  Darliss Cheney, MD  fluticasone (FLONASE) 50 MCG/ACT nasal spray Place 1 spray into both nostrils daily as needed for allergies or rhinitis. 12/24/19   [provider]  fluticasone (FLOVENT HFA) 44 MCG/ACT inhaler Inhale 2 puffs into the lungs 2 (two) times daily.    [provider]  Multiple Vitamin (MULTIVITAMIN WITH MINERALS) TABS tablet Take 1 tablet by mouth daily with lunch.    [provider]  Multiple Vitamins-Minerals (OCUVITE ADULT 50+) CAPS Take 1 capsule by mouth daily with lunch.    [provider]  Omega-3 Fatty Acids (FISH OIL PO) Take 1 capsule by mouth daily with lunch.    [provider]  ondansetron (ZOFRAN-ODT) 4 MG disintegrating tablet Take 4 mg by mouth 3 (three) times daily as needed for nausea or vomiting. 04/09/22   [provider]  pantoprazole (PROTONIX) 20 MG tablet Take 20 mg by mouth every morning.    [provider]  Probiotic Product (PROBIOTIC PO) Take 1 tablet by mouth daily after breakfast.    [provider]  Spacer/Aero-Holding Chambers ([place])  Revloc  12/20/19   [provider]  telmisartan (MICARDIS) 40 MG tablet Take 40 mg by mouth every morning. 03/17/20   [provider]  venlafaxine XR (EFFEXOR-XR) 37.5 MG 24 hr capsule Take 37.5 mg by mouth daily with lunch. 03/20/20   [provider]  Vitamin D, Ergocalciferol, (DRISDOL) 1.25 MG (50000 UNIT) CAPS capsule Take 50,000 Units by mouth every Thursday. 02/14/20   [provider]      Allergies    Statins, Amoxicillin, Clindamycin/lincomycin, Codeine, Gemfibrozil, and Other    Review of Systems   Review of Systems  Physical Exam Updated Vital Signs BP (!) 172/77 (BP Location: Left Arm)   Pulse 78   Temp (!) 97 F (36.1 C) (Oral)   Resp 18   Ht 5\' 5"  (1.651 m)   Wt 68.5 kg   SpO2 97%   BMI 25.13 kg/m  Physical Exam  ED Results / Procedures / Treatments   Labs (all labs ordered are listed, but only abnormal results are displayed) Labs Reviewed - No data to display  EKG None  Radiology DG Hand Complete Right  Result Date: 08/03/2022 CLINICAL DATA:  RIGHT hand pain post fall EXAM: RIGHT HAND - COMPLETE 3+ VIEW COMPARISON:  None Available. FINDINGS: Osseous demineralization. Advanced degenerative changes first [facility] joint with joint space narrowing, spur formation and radial subluxation of first metacarpal base. Comminuted displaced fracture distal fifth metacarpal. Displaced and angulated transverse fractures at the bases of  the proximal phalanges of the RIGHT middle, ring, and little fingers. Probable intra-articular extension at the third MCP joint, not definitely at the fourth and fifth MCP joints. Advanced degenerative changes of IP joints greatest at DIP joints of index and little fingers. No additional fracture or dislocation. Tiny rounded calcific body seen dorsal to the carpus on lateral view, appears old. IMPRESSION: Comminuted displaced and angulated fractures involving the distal fifth metacarpal and the bases of the proximal  phalanges of the middle, ring, and little fingers. Probable intra-articular extension at third MCP joint. Osseous demineralization with scattered degenerative changes of IP joints and at first [facility] joint RIGHT hand. Electronically Signed   By: Lavonia Dana M.D.   On: 08/03/2022 11:08    Procedures .Ortho Injury Treatment  Date/Time: 08/03/2022 2:26 PM  Performed by: Regan Lemming, MD Authorized by: Regan Lemming, MD   Consent:    Consent obtained:  Verbal   Consent given by:  PatientInjury location: hand Location details: right hand Injury type: fracture Fracture type: third metacarpal, fourth metacarpal and fifth metacarpal Pre-procedure neurovascular assessment: neurovascularly intact Pre-procedure distal perfusion: normal Pre-procedure neurological function: normal Pre-procedure range of motion: reduced Immobilization: splint Splint type: volar short arm Splint Applied by: ED Tech Post-procedure neurovascular assessment: post-procedure neurovascularly intact     {Document cardiac monitor, telemetry assessment procedure when appropriate:1}  Medications Ordered in ED Medications - No data to display  ED Course/ Medical Decision Making/ A&P                           Medical Decision Making Amount and/or Complexity of Data Reviewed Radiology: ordered.  Risk Prescription drug management.   ***  {Document critical care time when appropriate:1} {Document review of labs and clinical decision tools ie heart score, Chads2Vasc2 etc:1}  {Document your independent review of radiology images, and any outside records:1} {Document your discussion with family members, caretakers, and with consultants:1} {Document social determinants of health affecting pt's care:1} {Document your decision making why or why not admission, treatments were needed:1} Final Clinical Impression(s) / ED Diagnoses Final diagnoses:  None    Rx / DC Orders ED Discharge Orders     None

## 2022-08-03 NOTE — ED Notes (Signed)
Discharge paperwork given and verbally understood. 

## 2022-08-03 NOTE — ED Triage Notes (Signed)
Pt arrives via EMS. Pt was getting out of her car and tripped over a curb, causing her to fall. Pt did strike her head, no loc, no blood thinners. Pt does have a hematoma and laceration to the right side of forehead. Pt is AxOx4.

## 2022-08-03 NOTE — ED Notes (Addendum)
Laceration less than one inch to right anterior middle finger cleaned with wound spray and wrapped with 2X2 and coban. Bruising noted to right hand. Ice pack in place. Pt with significant swelling/bruising around right eye. Eye is almost swollen shut. Denies any vision issues at this time. Pt is going to CT scan. Dr. Armandina Gemma updated. Ice pack to her right eye as well. Pt states her son is aware she is at the ED.

## 2022-08-03 NOTE — ED Notes (Signed)
Patient transported to X-ray 

## 2022-08-05 DIAGNOSIS — S62306A Unspecified fracture of fifth metacarpal bone, right hand, initial encounter for closed fracture: Secondary | ICD-10-CM | POA: Diagnosis not present

## 2022-08-05 DIAGNOSIS — S62614A Displaced fracture of proximal phalanx of right ring finger, initial encounter for closed fracture: Secondary | ICD-10-CM | POA: Diagnosis not present

## 2022-08-05 DIAGNOSIS — S62616A Displaced fracture of proximal phalanx of right little finger, initial encounter for closed fracture: Secondary | ICD-10-CM | POA: Diagnosis not present

## 2022-08-09 DIAGNOSIS — S0231XD Fracture of orbital floor, right side, subsequent encounter for fracture with routine healing: Secondary | ICD-10-CM | POA: Diagnosis not present

## 2022-08-10 DIAGNOSIS — S62616A Displaced fracture of proximal phalanx of right little finger, initial encounter for closed fracture: Secondary | ICD-10-CM | POA: Diagnosis not present

## 2022-08-10 DIAGNOSIS — S62614A Displaced fracture of proximal phalanx of right ring finger, initial encounter for closed fracture: Secondary | ICD-10-CM | POA: Diagnosis not present

## 2022-08-10 DIAGNOSIS — S62612A Displaced fracture of proximal phalanx of right middle finger, initial encounter for closed fracture: Secondary | ICD-10-CM | POA: Diagnosis not present

## 2022-08-10 DIAGNOSIS — M19041 Primary osteoarthritis, right hand: Secondary | ICD-10-CM | POA: Diagnosis not present

## 2022-08-18 DIAGNOSIS — S0231XA Fracture of orbital floor, right side, initial encounter for closed fracture: Secondary | ICD-10-CM | POA: Diagnosis not present

## 2022-08-18 DIAGNOSIS — S0011XA Contusion of right eyelid and periocular area, initial encounter: Secondary | ICD-10-CM | POA: Diagnosis not present

## 2022-08-25 DIAGNOSIS — Z4789 Encounter for other orthopedic aftercare: Secondary | ICD-10-CM | POA: Diagnosis not present

## 2022-08-25 DIAGNOSIS — M79641 Pain in right hand: Secondary | ICD-10-CM | POA: Diagnosis not present

## 2022-08-27 DIAGNOSIS — M79641 Pain in right hand: Secondary | ICD-10-CM | POA: Diagnosis not present

## 2022-09-08 DIAGNOSIS — M79641 Pain in right hand: Secondary | ICD-10-CM | POA: Diagnosis not present

## 2022-09-08 DIAGNOSIS — Z4789 Encounter for other orthopedic aftercare: Secondary | ICD-10-CM | POA: Diagnosis not present

## 2022-09-16 DIAGNOSIS — M79641 Pain in right hand: Secondary | ICD-10-CM | POA: Diagnosis not present

## 2022-09-22 DIAGNOSIS — M79641 Pain in right hand: Secondary | ICD-10-CM | POA: Diagnosis not present

## 2022-09-30 DIAGNOSIS — M7062 Trochanteric bursitis, left hip: Secondary | ICD-10-CM | POA: Diagnosis not present

## 2022-09-30 DIAGNOSIS — R102 Pelvic and perineal pain: Secondary | ICD-10-CM | POA: Diagnosis not present

## 2022-09-30 DIAGNOSIS — Z4789 Encounter for other orthopedic aftercare: Secondary | ICD-10-CM | POA: Diagnosis not present

## 2022-09-30 DIAGNOSIS — M7061 Trochanteric bursitis, right hip: Secondary | ICD-10-CM | POA: Diagnosis not present

## 2022-10-08 DIAGNOSIS — M79641 Pain in right hand: Secondary | ICD-10-CM | POA: Diagnosis not present

## 2022-10-11 DIAGNOSIS — M79641 Pain in right hand: Secondary | ICD-10-CM | POA: Diagnosis not present

## 2022-10-15 DIAGNOSIS — M79641 Pain in right hand: Secondary | ICD-10-CM | POA: Diagnosis not present

## 2022-10-18 DIAGNOSIS — M79641 Pain in right hand: Secondary | ICD-10-CM | POA: Diagnosis not present

## 2022-10-20 DIAGNOSIS — H43811 Vitreous degeneration, right eye: Secondary | ICD-10-CM | POA: Diagnosis not present

## 2022-10-20 DIAGNOSIS — H3561 Retinal hemorrhage, right eye: Secondary | ICD-10-CM | POA: Diagnosis not present

## 2022-10-21 DIAGNOSIS — H3561 Retinal hemorrhage, right eye: Secondary | ICD-10-CM | POA: Diagnosis not present

## 2022-10-21 DIAGNOSIS — H3509 Other intraretinal microvascular abnormalities: Secondary | ICD-10-CM | POA: Diagnosis not present

## 2022-10-21 DIAGNOSIS — H4311 Vitreous hemorrhage, right eye: Secondary | ICD-10-CM | POA: Diagnosis not present

## 2022-10-22 DIAGNOSIS — M79641 Pain in right hand: Secondary | ICD-10-CM | POA: Diagnosis not present

## 2022-10-26 DIAGNOSIS — M79641 Pain in right hand: Secondary | ICD-10-CM | POA: Diagnosis not present

## 2022-10-28 DIAGNOSIS — H3561 Retinal hemorrhage, right eye: Secondary | ICD-10-CM | POA: Diagnosis not present

## 2022-10-28 DIAGNOSIS — H4311 Vitreous hemorrhage, right eye: Secondary | ICD-10-CM | POA: Diagnosis not present

## 2022-10-28 DIAGNOSIS — H3509 Other intraretinal microvascular abnormalities: Secondary | ICD-10-CM | POA: Diagnosis not present

## 2022-11-01 DIAGNOSIS — S62616D Displaced fracture of proximal phalanx of right little finger, subsequent encounter for fracture with routine healing: Secondary | ICD-10-CM | POA: Diagnosis not present

## 2022-11-01 DIAGNOSIS — S62612D Displaced fracture of proximal phalanx of right middle finger, subsequent encounter for fracture with routine healing: Secondary | ICD-10-CM | POA: Diagnosis not present

## 2022-11-01 DIAGNOSIS — Z4789 Encounter for other orthopedic aftercare: Secondary | ICD-10-CM | POA: Diagnosis not present

## 2022-11-01 DIAGNOSIS — S62614D Displaced fracture of proximal phalanx of right ring finger, subsequent encounter for fracture with routine healing: Secondary | ICD-10-CM | POA: Diagnosis not present

## 2022-11-02 DIAGNOSIS — K219 Gastro-esophageal reflux disease without esophagitis: Secondary | ICD-10-CM | POA: Diagnosis not present

## 2022-11-03 DIAGNOSIS — H33321 Round hole, right eye: Secondary | ICD-10-CM | POA: Diagnosis not present

## 2022-11-03 DIAGNOSIS — H3509 Other intraretinal microvascular abnormalities: Secondary | ICD-10-CM | POA: Diagnosis not present

## 2022-11-03 DIAGNOSIS — H4311 Vitreous hemorrhage, right eye: Secondary | ICD-10-CM | POA: Diagnosis not present

## 2022-11-03 DIAGNOSIS — H3561 Retinal hemorrhage, right eye: Secondary | ICD-10-CM | POA: Diagnosis not present

## 2022-11-05 DIAGNOSIS — M79641 Pain in right hand: Secondary | ICD-10-CM | POA: Diagnosis not present

## 2022-11-10 DIAGNOSIS — S0231XD Fracture of orbital floor, right side, subsequent encounter for fracture with routine healing: Secondary | ICD-10-CM | POA: Diagnosis not present

## 2022-11-11 DIAGNOSIS — Z0000 Encounter for general adult medical examination without abnormal findings: Secondary | ICD-10-CM | POA: Diagnosis not present

## 2022-11-11 DIAGNOSIS — E559 Vitamin D deficiency, unspecified: Secondary | ICD-10-CM | POA: Diagnosis not present

## 2022-11-11 DIAGNOSIS — N1831 Chronic kidney disease, stage 3a: Secondary | ICD-10-CM | POA: Diagnosis not present

## 2022-11-11 DIAGNOSIS — M150 Primary generalized (osteo)arthritis: Secondary | ICD-10-CM | POA: Diagnosis not present

## 2022-11-11 DIAGNOSIS — E7800 Pure hypercholesterolemia, unspecified: Secondary | ICD-10-CM | POA: Diagnosis not present

## 2022-11-11 DIAGNOSIS — I10 Essential (primary) hypertension: Secondary | ICD-10-CM | POA: Diagnosis not present

## 2022-11-11 DIAGNOSIS — H3509 Other intraretinal microvascular abnormalities: Secondary | ICD-10-CM | POA: Diagnosis not present

## 2022-11-11 DIAGNOSIS — J45909 Unspecified asthma, uncomplicated: Secondary | ICD-10-CM | POA: Diagnosis not present

## 2022-11-11 DIAGNOSIS — H4311 Vitreous hemorrhage, right eye: Secondary | ICD-10-CM | POA: Diagnosis not present

## 2022-11-11 DIAGNOSIS — I700 Atherosclerosis of aorta: Secondary | ICD-10-CM | POA: Diagnosis not present

## 2022-11-11 DIAGNOSIS — H3561 Retinal hemorrhage, right eye: Secondary | ICD-10-CM | POA: Diagnosis not present

## 2022-11-11 DIAGNOSIS — F320 Major depressive disorder, single episode, mild: Secondary | ICD-10-CM | POA: Diagnosis not present

## 2022-11-12 ENCOUNTER — Other Ambulatory Visit: Payer: Self-pay | Admitting: Family Medicine

## 2022-11-12 DIAGNOSIS — E2839 Other primary ovarian failure: Secondary | ICD-10-CM

## 2022-11-16 DIAGNOSIS — M79641 Pain in right hand: Secondary | ICD-10-CM | POA: Diagnosis not present

## 2022-11-29 DIAGNOSIS — M79641 Pain in right hand: Secondary | ICD-10-CM | POA: Diagnosis not present

## 2022-12-17 DIAGNOSIS — M79641 Pain in right hand: Secondary | ICD-10-CM | POA: Diagnosis not present

## 2022-12-22 DIAGNOSIS — H3509 Other intraretinal microvascular abnormalities: Secondary | ICD-10-CM | POA: Diagnosis not present

## 2022-12-22 DIAGNOSIS — H3561 Retinal hemorrhage, right eye: Secondary | ICD-10-CM | POA: Diagnosis not present

## 2022-12-22 DIAGNOSIS — H4311 Vitreous hemorrhage, right eye: Secondary | ICD-10-CM | POA: Diagnosis not present

## 2023-01-06 DIAGNOSIS — M25561 Pain in right knee: Secondary | ICD-10-CM | POA: Diagnosis not present

## 2023-01-12 ENCOUNTER — Other Ambulatory Visit: Payer: Self-pay | Admitting: Family Medicine

## 2023-01-12 DIAGNOSIS — Z1231 Encounter for screening mammogram for malignant neoplasm of breast: Secondary | ICD-10-CM

## 2023-01-25 DIAGNOSIS — H524 Presbyopia: Secondary | ICD-10-CM | POA: Diagnosis not present

## 2023-01-25 DIAGNOSIS — Z961 Presence of intraocular lens: Secondary | ICD-10-CM | POA: Diagnosis not present

## 2023-01-25 DIAGNOSIS — H31001 Unspecified chorioretinal scars, right eye: Secondary | ICD-10-CM | POA: Diagnosis not present

## 2023-01-25 DIAGNOSIS — H52203 Unspecified astigmatism, bilateral: Secondary | ICD-10-CM | POA: Diagnosis not present

## 2023-01-25 DIAGNOSIS — H04123 Dry eye syndrome of bilateral lacrimal glands: Secondary | ICD-10-CM | POA: Diagnosis not present

## 2023-01-26 DIAGNOSIS — L720 Epidermal cyst: Secondary | ICD-10-CM | POA: Diagnosis not present

## 2023-01-26 DIAGNOSIS — L918 Other hypertrophic disorders of the skin: Secondary | ICD-10-CM | POA: Diagnosis not present

## 2023-01-26 DIAGNOSIS — D225 Melanocytic nevi of trunk: Secondary | ICD-10-CM | POA: Diagnosis not present

## 2023-01-26 DIAGNOSIS — L821 Other seborrheic keratosis: Secondary | ICD-10-CM | POA: Diagnosis not present

## 2023-01-26 DIAGNOSIS — L814 Other melanin hyperpigmentation: Secondary | ICD-10-CM | POA: Diagnosis not present

## 2023-03-03 DIAGNOSIS — M1711 Unilateral primary osteoarthritis, right knee: Secondary | ICD-10-CM | POA: Diagnosis not present

## 2023-03-10 DIAGNOSIS — M1711 Unilateral primary osteoarthritis, right knee: Secondary | ICD-10-CM | POA: Diagnosis not present

## 2023-03-17 DIAGNOSIS — M1711 Unilateral primary osteoarthritis, right knee: Secondary | ICD-10-CM | POA: Diagnosis not present

## 2023-03-22 DIAGNOSIS — H3509 Other intraretinal microvascular abnormalities: Secondary | ICD-10-CM | POA: Diagnosis not present

## 2023-03-22 DIAGNOSIS — H4311 Vitreous hemorrhage, right eye: Secondary | ICD-10-CM | POA: Diagnosis not present

## 2023-03-22 DIAGNOSIS — H3561 Retinal hemorrhage, right eye: Secondary | ICD-10-CM | POA: Diagnosis not present

## 2023-03-24 DIAGNOSIS — K219 Gastro-esophageal reflux disease without esophagitis: Secondary | ICD-10-CM | POA: Diagnosis not present

## 2023-03-24 DIAGNOSIS — F419 Anxiety disorder, unspecified: Secondary | ICD-10-CM | POA: Diagnosis not present

## 2023-03-24 DIAGNOSIS — I129 Hypertensive chronic kidney disease with stage 1 through stage 4 chronic kidney disease, or unspecified chronic kidney disease: Secondary | ICD-10-CM | POA: Diagnosis not present

## 2023-03-24 DIAGNOSIS — M810 Age-related osteoporosis without current pathological fracture: Secondary | ICD-10-CM | POA: Diagnosis not present

## 2023-03-24 DIAGNOSIS — I739 Peripheral vascular disease, unspecified: Secondary | ICD-10-CM | POA: Diagnosis not present

## 2023-03-24 DIAGNOSIS — H3530 Unspecified macular degeneration: Secondary | ICD-10-CM | POA: Diagnosis not present

## 2023-03-24 DIAGNOSIS — Q829 Congenital malformation of skin, unspecified: Secondary | ICD-10-CM | POA: Diagnosis not present

## 2023-03-24 DIAGNOSIS — M1990 Unspecified osteoarthritis, unspecified site: Secondary | ICD-10-CM | POA: Diagnosis not present

## 2023-03-24 DIAGNOSIS — D84821 Immunodeficiency due to drugs: Secondary | ICD-10-CM | POA: Diagnosis not present

## 2023-03-24 DIAGNOSIS — E785 Hyperlipidemia, unspecified: Secondary | ICD-10-CM | POA: Diagnosis not present

## 2023-03-24 DIAGNOSIS — R32 Unspecified urinary incontinence: Secondary | ICD-10-CM | POA: Diagnosis not present

## 2023-03-24 DIAGNOSIS — Z974 Presence of external hearing-aid: Secondary | ICD-10-CM | POA: Diagnosis not present

## 2023-04-01 ENCOUNTER — Ambulatory Visit: Admission: RE | Admit: 2023-04-01 | Payer: Medicare PPO | Source: Ambulatory Visit

## 2023-04-01 DIAGNOSIS — Z1231 Encounter for screening mammogram for malignant neoplasm of breast: Secondary | ICD-10-CM

## 2023-05-16 DIAGNOSIS — E7800 Pure hypercholesterolemia, unspecified: Secondary | ICD-10-CM | POA: Diagnosis not present

## 2023-05-16 DIAGNOSIS — M150 Primary generalized (osteo)arthritis: Secondary | ICD-10-CM | POA: Diagnosis not present

## 2023-05-16 DIAGNOSIS — I10 Essential (primary) hypertension: Secondary | ICD-10-CM | POA: Diagnosis not present

## 2023-05-16 DIAGNOSIS — J309 Allergic rhinitis, unspecified: Secondary | ICD-10-CM | POA: Diagnosis not present

## 2023-05-16 DIAGNOSIS — F320 Major depressive disorder, single episode, mild: Secondary | ICD-10-CM | POA: Diagnosis not present

## 2023-05-16 DIAGNOSIS — J45909 Unspecified asthma, uncomplicated: Secondary | ICD-10-CM | POA: Diagnosis not present

## 2023-05-16 DIAGNOSIS — R079 Chest pain, unspecified: Secondary | ICD-10-CM | POA: Diagnosis not present

## 2023-05-24 ENCOUNTER — Ambulatory Visit
Admission: RE | Admit: 2023-05-24 | Discharge: 2023-05-24 | Disposition: A | Discharge status: Home / Self Care | Payer: Medicare PPO | Source: Ambulatory Visit | Attending: Family Medicine | Admitting: Family Medicine

## 2023-05-24 DIAGNOSIS — N958 Other specified menopausal and perimenopausal disorders: Secondary | ICD-10-CM | POA: Diagnosis not present

## 2023-05-24 DIAGNOSIS — E349 Endocrine disorder, unspecified: Secondary | ICD-10-CM | POA: Diagnosis not present

## 2023-05-24 DIAGNOSIS — M8588 Other specified disorders of bone density and structure, other site: Secondary | ICD-10-CM | POA: Diagnosis not present

## 2023-05-24 DIAGNOSIS — Z853 Personal history of malignant neoplasm of breast: Secondary | ICD-10-CM | POA: Diagnosis not present

## 2023-05-24 DIAGNOSIS — E2839 Other primary ovarian failure: Secondary | ICD-10-CM

## 2023-06-11 DIAGNOSIS — Z23 Encounter for immunization: Secondary | ICD-10-CM | POA: Diagnosis not present

## 2023-06-15 ENCOUNTER — Encounter: Payer: Self-pay | Admitting: Podiatry

## 2023-06-15 ENCOUNTER — Ambulatory Visit: Payer: Medicare PPO | Admitting: Podiatry

## 2023-06-15 DIAGNOSIS — M7751 Other enthesopathy of right foot: Secondary | ICD-10-CM | POA: Diagnosis not present

## 2023-06-15 NOTE — Progress Notes (Signed)
Subjective:   Patient ID: Sonya Cantu, female   DOB: 82 y.o.   MRN: [id number]   HPI Patient presents stating has developed pain around the lesser MPJ right fluid buildup around the joint surface and states it has been bothering her more recently   ROS      Objective:  Physical Exam  Patient does have structural deformities digital deformities inflammatory capsulitis right     Assessment:  Inflammatory capsulitis consistent with digital deformity right     Plan:  Reviewed with patient careful sterile periarticular injection around the second MPJ right 3 mg dexamethasone Kenalog 5 mg Xylocaine and advised on support shoes reappoint to recheck as needed

## 2023-06-21 DIAGNOSIS — H1131 Conjunctival hemorrhage, right eye: Secondary | ICD-10-CM | POA: Diagnosis not present

## 2023-06-21 DIAGNOSIS — H353123 Nonexudative age-related macular degeneration, left eye, advanced atrophic without subfoveal involvement: Secondary | ICD-10-CM | POA: Diagnosis not present

## 2023-06-21 DIAGNOSIS — H3561 Retinal hemorrhage, right eye: Secondary | ICD-10-CM | POA: Diagnosis not present

## 2023-06-21 DIAGNOSIS — H4311 Vitreous hemorrhage, right eye: Secondary | ICD-10-CM | POA: Diagnosis not present

## 2023-08-03 DIAGNOSIS — M1712 Unilateral primary osteoarthritis, left knee: Secondary | ICD-10-CM | POA: Diagnosis not present

## 2023-08-15 DIAGNOSIS — N1831 Chronic kidney disease, stage 3a: Secondary | ICD-10-CM | POA: Diagnosis not present

## 2023-08-15 DIAGNOSIS — I10 Essential (primary) hypertension: Secondary | ICD-10-CM | POA: Diagnosis not present

## 2023-08-15 DIAGNOSIS — T7840XA Allergy, unspecified, initial encounter: Secondary | ICD-10-CM | POA: Diagnosis not present

## 2023-08-15 DIAGNOSIS — J45909 Unspecified asthma, uncomplicated: Secondary | ICD-10-CM | POA: Diagnosis not present

## 2023-08-15 DIAGNOSIS — J069 Acute upper respiratory infection, unspecified: Secondary | ICD-10-CM | POA: Diagnosis not present

## 2023-08-15 DIAGNOSIS — J309 Allergic rhinitis, unspecified: Secondary | ICD-10-CM | POA: Diagnosis not present

## 2023-08-18 DIAGNOSIS — L500 Allergic urticaria: Secondary | ICD-10-CM | POA: Diagnosis not present

## 2023-08-30 DIAGNOSIS — Z860100 Personal history of colon polyps, unspecified: Secondary | ICD-10-CM | POA: Diagnosis not present

## 2023-08-30 DIAGNOSIS — K219 Gastro-esophageal reflux disease without esophagitis: Secondary | ICD-10-CM | POA: Diagnosis not present

## 2023-09-01 DIAGNOSIS — N3941 Urge incontinence: Secondary | ICD-10-CM | POA: Diagnosis not present

## 2023-09-01 DIAGNOSIS — Z853 Personal history of malignant neoplasm of breast: Secondary | ICD-10-CM | POA: Diagnosis not present

## 2023-09-01 DIAGNOSIS — Z9189 Other specified personal risk factors, not elsewhere classified: Secondary | ICD-10-CM | POA: Diagnosis not present

## 2023-09-14 DIAGNOSIS — H353124 Nonexudative age-related macular degeneration, left eye, advanced atrophic with subfoveal involvement: Secondary | ICD-10-CM | POA: Diagnosis not present

## 2023-09-14 DIAGNOSIS — H3561 Retinal hemorrhage, right eye: Secondary | ICD-10-CM | POA: Diagnosis not present

## 2023-09-14 DIAGNOSIS — H4311 Vitreous hemorrhage, right eye: Secondary | ICD-10-CM | POA: Diagnosis not present

## 2024-01-06 ENCOUNTER — Ambulatory Visit: Admitting: Podiatry

## 2024-01-06 ENCOUNTER — Encounter: Payer: Self-pay | Admitting: Podiatry

## 2024-01-06 ENCOUNTER — Ambulatory Visit (INDEPENDENT_AMBULATORY_CARE_PROVIDER_SITE_OTHER)

## 2024-01-06 VITALS — Ht 65.0 in | Wt 151.0 lb

## 2024-01-06 DIAGNOSIS — M2041 Other hammer toe(s) (acquired), right foot: Secondary | ICD-10-CM | POA: Diagnosis not present

## 2024-01-06 DIAGNOSIS — M7752 Other enthesopathy of left foot: Secondary | ICD-10-CM

## 2024-01-06 DIAGNOSIS — M2042 Other hammer toe(s) (acquired), left foot: Secondary | ICD-10-CM

## 2024-01-06 MED ORDER — TRIAMCINOLONE ACETONIDE 10 MG/ML IJ SUSP
10.0000 mg | Freq: Once | INTRAMUSCULAR | Status: AC
  Administered 2024-01-06: 10 mg via INTRA_ARTICULAR

## 2024-01-09 ENCOUNTER — Ambulatory Visit: Admitting: Podiatry

## 2024-01-09 NOTE — Progress Notes (Signed)
 Subjective:   Patient ID: Sonya Cantu, female   DOB: 83 y.o.   MRN: [id number]   HPI Patient states that over the last week she has developed a lot of discomfort in the big toe joint left and it has been sore with shoe gear   ROS      Objective:  Physical Exam  Neurovascular status intact muscle strength adequate range of motion adequate with inflammation pain of the inner phalangeal joint left big toe swollen when pressed     Assessment:  Inflammatory capsulitis of the inner phalangeal joint left big toe painful with pressure.  May be systemic inflammation or localized     Plan:  H&P reviewed different options and x-ray.  Today I did sterile prep of the inner phalangeal joint 3 mg Dexasone Kenalog 5 mg Xylocaine I advised on wider shoes and patient will be seen back as symptoms indicate  X-rays were negative for signs of arthritis or fracture around the inner phalangeal joint left big toe

## 2024-01-12 ENCOUNTER — Ambulatory Visit: Admitting: Podiatry

## 2024-05-14 ENCOUNTER — Other Ambulatory Visit: Payer: Self-pay | Admitting: Family Medicine

## 2024-05-14 DIAGNOSIS — Z1231 Encounter for screening mammogram for malignant neoplasm of breast: Secondary | ICD-10-CM

## 2024-06-08 ENCOUNTER — Ambulatory Visit
Admission: RE | Admit: 2024-06-08 | Discharge: 2024-06-08 | Disposition: A | Discharge status: Home / Self Care | Source: Ambulatory Visit | Attending: Family Medicine | Admitting: Family Medicine

## 2024-06-08 DIAGNOSIS — Z1231 Encounter for screening mammogram for malignant neoplasm of breast: Secondary | ICD-10-CM

## 2024-06-11 ENCOUNTER — Encounter: Payer: Self-pay | Admitting: Podiatry

## 2024-06-11 ENCOUNTER — Ambulatory Visit: Admitting: Podiatry

## 2024-06-11 VITALS — Ht 65.0 in | Wt 151.0 lb

## 2024-06-11 DIAGNOSIS — G629 Polyneuropathy, unspecified: Secondary | ICD-10-CM

## 2024-06-11 DIAGNOSIS — M2042 Other hammer toe(s) (acquired), left foot: Secondary | ICD-10-CM

## 2024-06-11 DIAGNOSIS — M2141 Flat foot [pes planus] (acquired), right foot: Secondary | ICD-10-CM | POA: Diagnosis not present

## 2024-06-11 DIAGNOSIS — M779 Enthesopathy, unspecified: Secondary | ICD-10-CM

## 2024-06-11 DIAGNOSIS — M7751 Other enthesopathy of right foot: Secondary | ICD-10-CM

## 2024-06-11 DIAGNOSIS — M2142 Flat foot [pes planus] (acquired), left foot: Secondary | ICD-10-CM

## 2024-06-11 NOTE — Progress Notes (Signed)
 Patient was present and evaluated for Custom molded foot orthotics. Patient will benefit from CFO's to provide total contact to BIL MLA's helping to balance and distribute body weight more evenly across BIL feet helping to reduce plantar pressure and pain. Orthotic will also encourage FF / RF alignment  Patient was scanned today will submit to [facility] Employee program for prior auth if approved will place order if not will call patient to advise  Lolita Schultze Cped, CFo, CFm  Will submit once Dr has added his note

## 2024-06-11 NOTE — Progress Notes (Signed)
 Subjective:   Patient ID: Sonya Cantu, female   DOB: 83 y.o.   MRN: [id number]   HPI Patient presents stating she is getting increasing numbness in both of her feet and also is concerned about foot pain and instability.  Patient states that she had orthotics approximately 5 years ago which have done very well but they are flattening and not effective anymore   ROS      Objective:  Physical Exam  Neurovascular status intact with numbness of both feet from the midfoot distal and inflammation with discomfort around the metatarsal phalangeal joints with mild diminishment of the fat pad noted.  Patient does have good digital perfusion well-oriented     Assessment:  Possibility for low-grade neuropathy along with inflammation and diminished fat pad plantarly with pain     Plan:  H&P condition reviewed at great length.  I do think orthotics of a softer permeable material will alleviate some of the discomfort and the imbalance structure that she experiences and hopefully will keep her from having falls in the future.  I do think this is important and she is evaluated by her pedorthist who agrees and she is now casted for functional orthotic devices

## 2024-06-26 ENCOUNTER — Telehealth: Payer: Self-pay

## 2024-06-26 NOTE — Telephone Encounter (Signed)
 Prior auth rcvd with note :NO AUTH REQUIRED [id number]  Patient has already been billed and has a balance of $502.00 for orthotics.   Patient would like to move forward in the process and is aware of the charge. She may need to sign a financial form as I don't see one on file. I did also tell her we offer payment plans.

## 2024-08-02 ENCOUNTER — Ambulatory Visit

## 2024-08-02 NOTE — Progress Notes (Signed)
 Patient presents today to pick up custom molded foot orthotics, diagnosed with Tendonitis by Dr. Magdalen .   Orthotics were dispensed and fit was satisfactory. Reviewed instructions for break-in and wear. Written instructions given to patient.  Patient will follow up as needed.

## 2024-08-29 ENCOUNTER — Telehealth: Payer: Self-pay | Admitting: Podiatry

## 2024-08-29 NOTE — Telephone Encounter (Signed)
 Patel requested to send message to Dr. Magdalen, right foot having issues when wearing orthotics. Contact number, [phone]

## 2024-09-03 ENCOUNTER — Encounter: Payer: Self-pay | Admitting: Podiatry

## 2024-09-03 ENCOUNTER — Ambulatory Visit: Admitting: Podiatry

## 2024-09-03 DIAGNOSIS — M7751 Other enthesopathy of right foot: Secondary | ICD-10-CM

## 2024-09-03 DIAGNOSIS — G629 Polyneuropathy, unspecified: Secondary | ICD-10-CM

## 2024-09-04 NOTE — Progress Notes (Signed)
 Subjective:   Patient ID: Sonya Cantu, female   DOB: 83 y.o.   MRN: [id number]   HPI Patient concerned because she is still having pain in both of her feet and states the orthotics do not seem to be helping her to the same degree as previous   ROS      Objective:  Physical Exam  Neurovascular status was found to be moderately diminished with patient found to have discomfort quite a bit into the forefoot bilateral and also around the arch area bilateral right over left     Assessment:  Appears to be more of an inflammatory condition with a possibility for also neuropathic condition     Plan:  H&P reviewed looked at the orthotics they are soft they do appear to be  likes the shoes and we discussed what shoes would be best and we reviewed neuropathic like symptomatology and I discussed that element with her.  The patient will see our pedorthist and we can make a decision on whether or not they require any other treatment fitted well but she still is having symptoms and I am hoping that shoe gear changes will make the difference some issues.  I have recommended that she try them in different

## 2024-10-08 NOTE — Progress Notes (Unsigned)
 " Cardiology Office Note:    Date:  10/08/2024   ID:  Sonya Cantu, DOB 09-May-1941, MRN [id number]  PCP:  Leonel Cole, MD  Cardiologist:  None  Electrophysiologist:  None   Referring MD: Leonel Cole, MD   No chief complaint on file. ***  History of Present Illness:    Sonya Cantu is a 84 y.o. female with a hx of breast cancer, hypertension who is referred by Dr. Leonel for evaluation of diastolic dysfunction and wide pulse pressure.  Past Medical History:  Diagnosis Date   Breast cancer [facility]) 2005   right   Hypertension    Personal history of radiation therapy     Past Surgical History:  Procedure Laterality Date   BREAST BIOPSY Right 12/03/2003   Malignant Core    BREAST LUMPECTOMY Right 2005    Current Medications: Active Medications[1]   Allergies:   Statins, Amoxicillin, Clindamycin/lincomycin, Codeine, Gemfibrozil, and Other   Social History   Socioeconomic History   Marital status: Widowed    Spouse name: Not on file   Number of children: Not on file   Years of education: Not on file   Highest education level: Not on file  Occupational History   Not on file  Tobacco Use   Smoking status: Never   Smokeless tobacco: Never  Substance and Sexual Activity   Alcohol use: Not Currently   Drug use: Never   Sexual activity: Not Currently  Other Topics Concern   Not on file  Social History Narrative   Not on file   Social Drivers of Health   Tobacco Use: Low Risk (09/03/2024)   Patient History    Smoking Tobacco Use: Never    Smokeless Tobacco Use: Never    Passive Exposure: Not on file  Financial Resource Strain: Not on file  Food Insecurity: Not on file  Transportation Needs: Not on file  Physical Activity: Not on file  Stress: Not on file  Social Connections: Not on file  Depression (EYV7-0): Not on file  Alcohol Screen: Not on file  Housing: Not on file  Utilities: Not on file  Health Literacy: Not on file     Family History: The  patient's ***family history includes Breast cancer in her maternal grandmother.  ROS:   Please see the history of present illness.    *** All other systems reviewed and are negative.  EKGs/Labs/Other Studies Reviewed:    The following studies were reviewed today: ***  EKG:  EKG is *** ordered today.  The ekg ordered today demonstrates ***  Recent Labs: No results found for requested labs within last 365 days.  Recent Lipid Panel No results found for: CHOL, TRIG, HDL, CHOLHDL, VLDL, LDLCALC, LDLDIRECT  Physical Exam:    VS:  There were no vitals taken for this visit.    Wt Readings from Last 3 Encounters:  06/11/24 151 lb (68.5 kg)  01/06/24 151 lb (68.5 kg)  08/03/22 151 lb (68.5 kg)     GEN: *** Well nourished, well developed in no acute distress HEENT: Normal NECK: No JVD; No carotid bruits LYMPHATICS: No lymphadenopathy CARDIAC: ***RRR, no murmurs, rubs, gallops RESPIRATORY:  Clear to auscultation without rales, wheezing or rhonchi  ABDOMEN: Soft, non-tender, non-distended MUSCULOSKELETAL:  No edema; No deformity  SKIN: Warm and dry NEUROLOGIC:  Alert and oriented x 3 PSYCHIATRIC:  Normal affect   ASSESSMENT:    No diagnosis found. PLAN:    Wide pulse pressure: Check echocardiogram to evaluate for  aortic regurgitation  Hypertension: On telmisartan 40 mg daily, HCTZ 12.5 mg daily, diltiazem  240 mg daily  RTC in***   Medication Adjustments/Labs and Tests Ordered: Current medicines are reviewed at length with the patient today.  Concerns regarding medicines are outlined above.  No orders of the defined types were placed in this encounter.  No orders of the defined types were placed in this encounter.   There are no Patient Instructions on file for this visit.   Signed, Lonni LITTIE Nanas, MD  10/08/2024 5:51 PM    [facility]    [1]  No outpatient medications have been marked as taking for the 10/10/24  encounter (Appointment) with Nanas Lonni LITTIE, MD.   "

## 2024-10-10 ENCOUNTER — Ambulatory Visit: Admitting: Cardiology

## 2024-10-15 NOTE — Progress Notes (Unsigned)
 " Cardiology Office Note:    Date:  10/18/2024   ID:  Sonya Cantu, DOB 1941/04/11, MRN [id number]  PCP:  Leonel Cole, MD  Cardiologist:  None  Electrophysiologist:  None   Referring MD: Leonel Cole, MD   Chief Complaint  Patient presents with   Hypertension    History of Present Illness:    Sonya Cantu is a 84 y.o. female with a hx of breast cancer, hypertension who is referred by Dr. Leonel for evaluation of diastolic dysfunction and wide pulse pressure. Denies any chest pain, dyspnea, lightheadedness, syncope, lower extremity edema, or palpitations.  Smoked at least 0.5ppd, for 40 years, quit age 69.  No known family history of heart disease.  Carotid duplex in 2010 showed plaque in bilateral carotid arteries causing less than 50% stenosis.   Past Medical History:  Diagnosis Date   Breast cancer [facility]) 2005   right   Hypertension    Personal history of radiation therapy     Past Surgical History:  Procedure Laterality Date   BREAST BIOPSY Right 12/03/2003   Malignant Core    BREAST LUMPECTOMY Right 2005    Current Medications: Active Medications[1]   Allergies:   Statins, Amoxicillin, Clindamycin/lincomycin, Codeine, Gemfibrozil, and Other   Social History   Socioeconomic History   Marital status: Widowed    Spouse name: Not on file   Number of children: Not on file   Years of education: Not on file   Highest education level: Not on file  Occupational History   Not on file  Tobacco Use   Smoking status: Never   Smokeless tobacco: Never  Substance and Sexual Activity   Alcohol use: Not Currently   Drug use: Never   Sexual activity: Not Currently  Other Topics Concern   Not on file  Social History Narrative   Not on file   Social Drivers of Health   Tobacco Use: Low Risk (09/03/2024)   Patient History    Smoking Tobacco Use: Never    Smokeless Tobacco Use: Never    Passive Exposure: Not on file  Financial Resource Strain: Not on file  Food  Insecurity: Not on file  Transportation Needs: Not on file  Physical Activity: Not on file  Stress: Not on file  Social Connections: Not on file  Depression (EYV7-0): Not on file  Alcohol Screen: Not on file  Housing: Not on file  Utilities: Not on file  Health Literacy: Not on file     Family History: The patient's family history includes Breast cancer in her maternal grandmother.  ROS:   Please see the history of present illness.     All other systems reviewed and are negative.  EKGs/Labs/Other Studies Reviewed:    The following studies were reviewed today:   EKG:   10/18/2024: Normal sinus rhythm, rate 60, no ST abnormalities  Recent Labs: No results found for requested labs within last 365 days.  Recent Lipid Panel No results found for: CHOL, TRIG, HDL, CHOLHDL, VLDL, LDLCALC, LDLDIRECT  Physical Exam:    VS:  BP (!) 154/68 (BP Location: Left Arm, Patient Position: Sitting, Cuff Size: Normal)   Pulse 60   Ht 5' 7.2 (1.707 m)   Wt 148 lb 6.4 oz (67.3 kg)   SpO2 94%   BMI 23.10 kg/m     Wt Readings from Last 3 Encounters:  10/18/24 148 lb 6.4 oz (67.3 kg)  06/11/24 151 lb (68.5 kg)  01/06/24 151 lb (68.5 kg)  GEN:  Well nourished, well developed in no acute distress HEENT: Normal NECK: No JVD; right carotid bruit  LYMPHATICS: No lymphadenopathy CARDIAC: RRR, no murmurs, rubs, gallops RESPIRATORY:  Clear to auscultation without rales, wheezing or rhonchi  ABDOMEN: Soft, non-tender, non-distended MUSCULOSKELETAL:  No edema; No deformity  SKIN: Warm and dry NEUROLOGIC:  Alert and oriented x 3 PSYCHIATRIC:  Normal affect   ASSESSMENT:    1. Essential hypertension   2. Widened pulse pressure   3. Bilateral carotid bruits   4. Hyperlipidemia, unspecified hyperlipidemia type    PLAN:    Wide pulse pressure: Check echocardiogram to evaluate for aortic regurgitation  Hypertension: On telmisartan 40 mg daily, HCTZ 12.5 mg daily,  diltiazem  240 mg daily  Carotid stenosis: Less than 50% stenosis in bilateral carotid arteries on duplex in 2010.  Right carotid bruit on exam.  Check carotid duplex.  Hyperlipidemia: LDL 144 06/18/2024.  Has been unable to tolerate statins.  Given carotid stenosis with goal LDL less than 70, will refer to pharmacy lipid clinic  RTC in 4 months   Medication Adjustments/Labs and Tests Ordered: Current medicines are reviewed at length with the patient today.  Concerns regarding medicines are outlined above.  Orders Placed This Encounter  Procedures   AMB Referral to [facility]   EKG 12-Lead   ECHOCARDIOGRAM COMPLETE   No orders of the defined types were placed in this encounter.   Patient Instructions  Medication Instructions:  Your physician recommends that you continue on your current medications as directed. Please refer to the Current Medication list given to you today.  *If you need a refill on your cardiac medications before your next appointment, please call your pharmacy*  Lab Work: none If you have labs (blood work) drawn today and your tests are completely normal, you will receive your results only by: MyChart Message (if you have MyChart) OR A paper copy in the mail If you have any lab test that is abnormal or we need to change your treatment, we will call you to review the results.  Testing/Procedures: Your physician has requested that you have an echocardiogram. Echocardiography is a painless test that uses sound waves to create images of your heart. It provides your doctor with information about the size and shape of your heart and how well your hearts chambers and valves are working. This procedure takes approximately one hour. There are no restrictions for this procedure. Please do NOT wear cologne, perfume, aftershave, or lotions (deodorant is allowed). Please arrive 15 minutes prior to your appointment time.  Please note: We ask at that you not bring  children with you during ultrasound (echo/ vascular) testing. Due to room size and safety concerns, children are not allowed in the ultrasound rooms during exams. Our front office staff cannot provide observation of children in our lobby area while testing is being conducted. An adult accompanying a patient to their appointment will only be allowed in the ultrasound room at the discretion of the ultrasound technician under special circumstances. We apologize for any inconvenience.   Follow-Up: At [facility], you and your health needs are our priority.  As part of our continuing mission to provide you with exceptional heart care, our providers are all part of one team.  This team includes your primary Cardiologist (physician) and Advanced Practice Providers or APPs (Physician Assistants and Nurse Practitioners) who all work together to provide you with the care you need, when you need it.  Your next appointment:  4 months  Provider:   Dr. Kate  We recommend signing up for the patient portal called MyChart.  Sign up information is provided on this After Visit Summary.  MyChart is used to connect with patients for Virtual Visits (Telemedicine).  Patients are able to view lab/test results, encounter notes, upcoming appointments, etc.  Non-urgent messages can be sent to your provider as well.   To learn more about what you can do with MyChart, go to [email or web address].   Other Instructions Your physician has requested that you have a carotid duplex. This test is an ultrasound of the carotid arteries in your neck. It looks at blood flow through these arteries that supply the brain with blood. Allow one hour for this exam. There are no restrictions or special instructions.    Referral to Pharm D             Signed, Lonni LITTIE Kate, MD  10/18/2024 12:59 PM    [facility]     [1]  Current Meds  Medication Sig   acetaminophen  (TYLENOL ) 650  MG CR tablet Take 1,300 mg by mouth 2 (two) times daily.   ALPRAZolam (XANAX) 0.25 MG tablet Take 0.25 mg by mouth at bedtime as needed for anxiety.   DILT-XR 240 MG 24 hr capsule Take 240 mg by mouth daily.   fluticasone (FLONASE) 50 MCG/ACT nasal spray Place 1 spray into both nostrils daily as needed for allergies or rhinitis.   fluticasone (FLOVENT HFA) 44 MCG/ACT inhaler Inhale 2 puffs into the lungs 2 (two) times daily.   hydrochlorothiazide (HYDRODIURIL) 12.5 MG tablet Take 12.5 mg by mouth daily.   HYDROcodone -acetaminophen  (NORCO/VICODIN) 5-325 MG tablet Take 1-2 tablets by mouth every 6 (six) hours as needed.   Multiple Vitamin (MULTIVITAMIN WITH MINERALS) TABS tablet Take 1 tablet by mouth daily with lunch.   Multiple Vitamins-Minerals (OCUVITE ADULT 50+) CAPS Take 1 capsule by mouth daily with lunch.   Omega-3 Fatty Acids (FISH OIL PO) Take 1 capsule by mouth daily with lunch.   ondansetron (ZOFRAN-ODT) 4 MG disintegrating tablet Take 4 mg by mouth 3 (three) times daily as needed for nausea or vomiting.   pantoprazole  (PROTONIX ) 20 MG tablet Take 20 mg by mouth every morning.   Probiotic Product (PROBIOTIC PO) Take 1 tablet by mouth daily after breakfast.   Spacer/Aero-Holding Chambers (OPTICHAMBER DIAMOND) MISC    telmisartan (MICARDIS) 40 MG tablet Take 40 mg by mouth every morning.   venlafaxine  XR (EFFEXOR -XR) 37.5 MG 24 hr capsule Take 37.5 mg by mouth daily with lunch.   Vitamin D , Ergocalciferol , (DRISDOL) 1.25 MG (50000 UNIT) CAPS capsule Take 50,000 Units by mouth every Thursday.   "

## 2024-10-18 ENCOUNTER — Other Ambulatory Visit: Payer: Self-pay | Admitting: *Deleted

## 2024-10-18 ENCOUNTER — Ambulatory Visit: Attending: Cardiology | Admitting: Cardiology

## 2024-10-18 VITALS — BP 154/68 | HR 60 | Ht 67.2 in | Wt 148.4 lb

## 2024-10-18 DIAGNOSIS — E785 Hyperlipidemia, unspecified: Secondary | ICD-10-CM | POA: Diagnosis not present

## 2024-10-18 DIAGNOSIS — R0989 Other specified symptoms and signs involving the circulatory and respiratory systems: Secondary | ICD-10-CM

## 2024-10-18 DIAGNOSIS — I10 Essential (primary) hypertension: Secondary | ICD-10-CM

## 2024-10-18 NOTE — Patient Instructions (Signed)
 Medication Instructions:  Your physician recommends that you continue on your current medications as directed. Please refer to the Current Medication list given to you today.  *If you need a refill on your cardiac medications before your next appointment, please call your pharmacy*  Lab Work: none If you have labs (blood work) drawn today and your tests are completely normal, you will receive your results only by: MyChart Message (if you have MyChart) OR A paper copy in the mail If you have any lab test that is abnormal or we need to change your treatment, we will call you to review the results.  Testing/Procedures: Your physician has requested that you have an echocardiogram. Echocardiography is a painless test that uses sound waves to create images of your heart. It provides your doctor with information about the size and shape of your heart and how well your hearts chambers and valves are working. This procedure takes approximately one hour. There are no restrictions for this procedure. Please do NOT wear cologne, perfume, aftershave, or lotions (deodorant is allowed). Please arrive 15 minutes prior to your appointment time.  Please note: We ask at that you not bring children with you during ultrasound (echo/ vascular) testing. Due to room size and safety concerns, children are not allowed in the ultrasound rooms during exams. Our front office staff cannot provide observation of children in our lobby area while testing is being conducted. An adult accompanying a patient to their appointment will only be allowed in the ultrasound room at the discretion of the ultrasound technician under special circumstances. We apologize for any inconvenience.   Follow-Up: At [facility], you and your health needs are our priority.  As part of our continuing mission to provide you with exceptional heart care, our providers are all part of one team.  This team includes your primary Cardiologist  (physician) and Advanced Practice Providers or APPs (Physician Assistants and Nurse Practitioners) who all work together to provide you with the care you need, when you need it.  Your next appointment:   4 months  Provider:   Dr. Kate  We recommend signing up for the patient portal called MyChart.  Sign up information is provided on this After Visit Summary.  MyChart is used to connect with patients for Virtual Visits (Telemedicine).  Patients are able to view lab/test results, encounter notes, upcoming appointments, etc.  Non-urgent messages can be sent to your provider as well.   To learn more about what you can do with MyChart, go to [email or web address].   Other Instructions Your physician has requested that you have a carotid duplex. This test is an ultrasound of the carotid arteries in your neck. It looks at blood flow through these arteries that supply the brain with blood. Allow one hour for this exam. There are no restrictions or special instructions.    Referral to Pharm D

## 2024-10-18 NOTE — Progress Notes (Signed)
 Ordered vas carotid duplex per Dr. Kate

## 2024-11-01 ENCOUNTER — Ambulatory Visit (HOSPITAL_COMMUNITY)
Admission: RE | Admit: 2024-11-01 | Discharge: 2024-11-01 | Disposition: A | Source: Ambulatory Visit | Attending: Cardiology

## 2024-11-01 DIAGNOSIS — R0989 Other specified symptoms and signs involving the circulatory and respiratory systems: Secondary | ICD-10-CM

## 2024-11-02 ENCOUNTER — Ambulatory Visit: Payer: Self-pay | Admitting: Cardiology

## 2024-11-22 ENCOUNTER — Ambulatory Visit (HOSPITAL_COMMUNITY)

## 2024-12-07 ENCOUNTER — Ambulatory Visit

## 2025-02-19 ENCOUNTER — Ambulatory Visit: Admitting: Cardiology
